# Patient Record
Sex: Male | Born: 1995 | Race: Black or African American | Hispanic: No | Marital: Single | Smoking: Never smoker
Health system: Southern US, Community
[De-identification: ages and names within clinical notes are randomized; demographics above are authoritative.]

---

## 2019-10-30 ENCOUNTER — Encounter (HOSPITAL_COMMUNITY): Payer: Self-pay | Admitting: Emergency Medicine

## 2019-10-30 ENCOUNTER — Ambulatory Visit (HOSPITAL_COMMUNITY)
Admission: EM | Admit: 2019-10-30 | Discharge: 2019-10-30 | Disposition: A | Discharge status: Home / Self Care | Payer: Self-pay | Attending: Family Medicine | Admitting: Family Medicine

## 2019-10-30 ENCOUNTER — Other Ambulatory Visit: Payer: Self-pay

## 2019-10-30 DIAGNOSIS — R0789 Other chest pain: Secondary | ICD-10-CM

## 2019-10-30 MED ORDER — IBUPROFEN 600 MG PO TABS: 600.0000 mg | ORAL_TABLET | Freq: Three times a day (TID) | ORAL | 0 refills | Status: DC | PRN

## 2019-10-30 MED ORDER — CYCLOBENZAPRINE HCL 10 MG PO TABS: 5.0000 mg | ORAL_TABLET | Freq: Every day | ORAL | 0 refills | Status: DC

## 2019-10-30 NOTE — ED Provider Notes (Signed)
Dawn    CSN: [id number] Arrival date & time: 10/30/19  5361      History   Chief Complaint Chief Complaint  Patient presents with  . Chest Pain    HPI Jiovanny Burdell is a 24 y.o. male.   Patient is a 24 year old male that presents today with left-sided chest pain.  This has been intermittent over the past 3 days.  The pain is worsened with certain movements, deep breathing.  Does a lot of heavy lifting at work.  Denies any associated cough, chest congestion, fever, shortness of breath or injuries.  ROS per HPI      History reviewed. No pertinent past medical history.  There are no problems to display for this patient.   History reviewed. No pertinent surgical history.     Home Medications    Prior to Admission medications   Medication Sig Start Date End Date Taking? Authorizing Provider  cyclobenzaprine (FLEXERIL) 10 MG tablet Take 0.5 tablets (5 mg total) by mouth at bedtime. 10/30/19   Loura Halt A, NP  ibuprofen (ADVIL) 600 MG tablet Take 1 tablet (600 mg total) by mouth every 8 (eight) hours as needed for moderate pain. 10/30/19   Orvan July, NP    Family History Family History  Family history unknown: Yes    Social History Social History   Tobacco Use  . Smoking status: Never Smoker  Substance Use Topics  . Alcohol use: Not Currently  . Drug use: Not Currently    Types: Marijuana     Allergies   Patient has no known allergies.   Review of Systems Review of Systems   Physical Exam Triage Vital Signs ED Triage Vitals  Enc Vitals Group     BP 10/30/19 0948 119/72     Pulse Rate 10/30/19 0948 70     Resp 10/30/19 0948 16     Temp 10/30/19 0948 98.2 F (36.8 C)     Temp src --      SpO2 10/30/19 0948 100 %     Weight --      Height --      Head Circumference --      Peak Flow --      Pain Score 10/30/19 0949 0     Pain Loc --      Pain Edu? --      Excl. in [place]? --    No data found.  Updated Vital  Signs BP 119/72   Pulse 70   Temp 98.2 F (36.8 C)   Resp 16   SpO2 100%   Visual Acuity Right Eye Distance:   Left Eye Distance:   Bilateral Distance:    Right Eye Near:   Left Eye Near:    Bilateral Near:     Physical Exam Vitals and nursing note reviewed.  Constitutional:      General: He is not in acute distress.    Appearance: Normal appearance. He is not ill-appearing, toxic-appearing or diaphoretic.  HENT:     Head: Normocephalic and atraumatic.     Nose: Nose normal.  Eyes:     Conjunctiva/sclera: Conjunctivae normal.  Cardiovascular:     Rate and Rhythm: Normal rate and regular rhythm.     Pulses: Normal pulses.     Heart sounds: Normal heart sounds.  Pulmonary:     Effort: Pulmonary effort is normal.     Breath sounds: Normal breath sounds.  Chest:     Chest wall: Tenderness  present. No mass.       Comments: TTP Abdominal:     Palpations: Abdomen is soft.     Tenderness: There is no abdominal tenderness.  Musculoskeletal:        General: Normal range of motion.     Cervical back: Normal range of motion.  Skin:    General: Skin is warm and dry.  Neurological:     Mental Status: He is alert.  Psychiatric:        Mood and Affect: Mood normal.      UC Treatments / Results  Labs (all labs ordered are listed, but only abnormal results are displayed) Labs Reviewed - No data to display  EKG   Radiology No results found.  Procedures Procedures (including critical care time)  Medications Ordered in UC Medications - No data to display  Initial Impression / Assessment and Plan / UC Course  I have reviewed the triage vital signs and the nursing notes.  Pertinent labs & imaging results that were available during my care of the patient were reviewed by me and considered in my medical decision making (see chart for details).     Chest wall pain-chest pain is reproducible. Exam benign Will treat with low-dose muscle relaxant and ibuprofen  every 8 hours Recommended heat Follow up as needed for continued or worsening symptoms  Final Clinical Impressions(s) / UC Diagnoses   Final diagnoses:  Chest wall pain     Discharge Instructions     This is most likely musculoskeletal pain.  Take the ibuprofen every 8 hours for mild to moderate pain Low-dose muscle relaxant to use at bedtime You can apply heat to the area Follow up as needed for continued or worsening symptoms     ED Prescriptions    Medication Sig Dispense Auth. Provider   cyclobenzaprine (FLEXERIL) 10 MG tablet Take 0.5 tablets (5 mg total) by mouth at bedtime. 20 tablet Sherrian Nunnelley A, NP   ibuprofen (ADVIL) 600 MG tablet Take 1 tablet (600 mg total) by mouth every 8 (eight) hours as needed for moderate pain. 30 tablet Dahlia Byes A, NP     PDMP not reviewed this encounter.   Janace Aris, NP 10/30/19 1028

## 2019-10-30 NOTE — ED Triage Notes (Signed)
Pt c/o chest pain in the L side x3 days. States if he inhale/exhale, if he raises his L arm, if he does a sit up it pulls on the area as well.

## 2019-10-30 NOTE — Discharge Instructions (Signed)
This is most likely musculoskeletal pain.  Take the ibuprofen every 8 hours for mild to moderate pain Low-dose muscle relaxant to use at bedtime You can apply heat to the area Follow up as needed for continued or worsening symptoms

## 2020-05-20 ENCOUNTER — Other Ambulatory Visit: Payer: Self-pay

## 2020-05-20 ENCOUNTER — Other Ambulatory Visit: Payer: Self-pay | Admitting: Critical Care Medicine

## 2020-05-20 DIAGNOSIS — Z20822 Contact with and (suspected) exposure to covid-19: Secondary | ICD-10-CM

## 2020-05-22 LAB — NOVEL CORONAVIRUS, NAA: SARS-CoV-2, NAA: NOT DETECTED

## 2020-05-22 LAB — SARS-COV-2, NAA 2 DAY TAT

## 2020-07-04 ENCOUNTER — Other Ambulatory Visit: Payer: Self-pay

## 2020-07-04 ENCOUNTER — Encounter (HOSPITAL_COMMUNITY): Payer: Self-pay | Admitting: *Deleted

## 2020-07-04 ENCOUNTER — Ambulatory Visit (HOSPITAL_COMMUNITY)
Admission: EM | Admit: 2020-07-04 | Discharge: 2020-07-04 | Disposition: A | Discharge status: Home / Self Care | Payer: Self-pay | Attending: Family Medicine | Admitting: Family Medicine

## 2020-07-04 DIAGNOSIS — K029 Dental caries, unspecified: Secondary | ICD-10-CM

## 2020-07-04 DIAGNOSIS — K047 Periapical abscess without sinus: Secondary | ICD-10-CM

## 2020-07-04 MED ORDER — AMOXICILLIN 500 MG PO CAPS: 1000.0000 mg | ORAL_CAPSULE | Freq: Two times a day (BID) | ORAL | 0 refills | Status: DC

## 2020-07-04 MED ORDER — KETOROLAC TROMETHAMINE 30 MG/ML IJ SOLN
INTRAMUSCULAR | Status: AC
  Filled 2020-07-04: qty 1

## 2020-07-04 MED ORDER — KETOROLAC TROMETHAMINE 30 MG/ML IJ SOLN
30.0000 mg | Freq: Once | INTRAMUSCULAR | Status: AC
  Administered 2020-07-04: 30 mg via INTRAMUSCULAR

## 2020-07-04 MED ORDER — IBUPROFEN 600 MG PO TABS: 600.0000 mg | ORAL_TABLET | Freq: Three times a day (TID) | ORAL | 0 refills | Status: DC | PRN

## 2020-07-04 NOTE — Discharge Instructions (Addendum)
Treating you for a dental infection.  Take the medication as prescribed. Resources given for dental for follow-up in your discharge instructions.  [facility] - [facility] Address: [address] [place], [place], [address] Phone: [phone]   - Dr. Lawrence Marseilles Address: [address] [place], [place], [address] Phone: [phone]

## 2020-07-04 NOTE — ED Triage Notes (Signed)
Pt reports dental pain in all 4 corners of his mouth top and bottom.

## 2020-07-04 NOTE — ED Provider Notes (Signed)
[facility]    CSN: [id number] Arrival date & time: 07/04/20  2440      History   Chief Complaint Chief Complaint  Patient presents with  . Dental Pain    HPI Rick Cooper is a 24 y.o. male.   Pt is a 24 year old male that presents with dental pain. Generalized to upper and lower molar areas.  Has multiple cracked teeth and dental caries. No fever or trismus. Using OTC medicines to treat without much relief.      History reviewed. No pertinent past medical history.  There are no problems to display for this patient.   History reviewed. No pertinent surgical history.     Home Medications    Prior to Admission medications   Medication Sig Start Date End Date Taking? Authorizing Provider  amoxicillin (AMOXIL) 500 MG capsule Take 2 capsules (1,000 mg total) by mouth 2 (two) times daily. 07/04/20   Dahlia Byes A, NP  ibuprofen (ADVIL) 600 MG tablet Take 1 tablet (600 mg total) by mouth every 8 (eight) hours as needed for moderate pain. 07/04/20   Janace Aris, NP    Family History Family History  Family history unknown: Yes    Social History Social History   Tobacco Use  . Smoking status: Never Smoker  . Smokeless tobacco: Never Used  Substance Use Topics  . Alcohol use: Not Currently  . Drug use: Not Currently    Types: Marijuana     Allergies   Patient has no known allergies.   Review of Systems Review of Systems   Physical Exam Triage Vital Signs ED Triage Vitals  Enc Vitals Group     BP 07/04/20 0910 (!) 102/33     Pulse Rate 07/04/20 0910 97     Resp 07/04/20 0910 16     Temp 07/04/20 0910 98 F (36.7 C)     Temp Source 07/04/20 0910 Oral     SpO2 07/04/20 0910 98 %     Weight 07/04/20 0914 135 lb (61.2 kg)     Height 07/04/20 0914 6' (1.829 m)     Head Circumference --      Peak Flow --      Pain Score 07/04/20 0914 4     Pain Loc --      Pain Edu? --      Excl. in [place]? --    No data found.  Updated Vital  Signs BP (!) 102/33 (BP Location: Right Arm)   Pulse 97   Temp 98 F (36.7 C) (Oral)   Resp 16   Ht 6' (1.829 m)   Wt 135 lb (61.2 kg)   SpO2 98%   BMI 18.31 kg/m   Visual Acuity Right Eye Distance:   Left Eye Distance:   Bilateral Distance:    Right Eye Near:   Left Eye Near:    Bilateral Near:     Physical Exam Vitals and nursing note reviewed.  Constitutional:      Appearance: Normal appearance.  HENT:     Head: Normocephalic and atraumatic.     Nose: Nose normal.     Mouth/Throat:     Dentition: Dental tenderness, gingival swelling and dental caries present.  Eyes:     Conjunctiva/sclera: Conjunctivae normal.  Pulmonary:     Effort: Pulmonary effort is normal.  Musculoskeletal:        General: Normal range of motion.     Cervical back: Normal range of motion.  Skin:    General: Skin is warm and dry.  Neurological:     Mental Status: He is alert.  Psychiatric:        Mood and Affect: Mood normal.      UC Treatments / Results  Labs (all labs ordered are listed, but only abnormal results are displayed) Labs Reviewed - No data to display  EKG   Radiology No results found.  Procedures Procedures (including critical care time)  Medications Ordered in UC Medications  ketorolac (TORADOL) 30 MG/ML injection 30 mg (30 mg Intramuscular Given 07/04/20 0953)    Initial Impression / Assessment and Plan / UC Course  I have reviewed the triage vital signs and the nursing notes.  Pertinent labs & imaging results that were available during my care of the patient were reviewed by me and considered in my medical decision making (see chart for details).     Dental caries Dental infection Treating with abx and ibuprofen for pain Toradol given here for pain.  Dental resources given.  Final Clinical Impressions(s) / UC Diagnoses   Final diagnoses:  Dental caries  Dental infection     Discharge Instructions     Treating you for a dental infection.   Take the medication as prescribed. Resources given for dental for follow-up in your discharge instructions.  [facility] - [facility] Address: [address], [place], [place], [address] Phone: [phone]   - Dr. Lawrence Marseilles Address: [address], [place], [place], [address] Phone: [phone]       ED Prescriptions    Medication Sig Dispense Auth. Provider   amoxicillin (AMOXIL) 500 MG capsule Take 2 capsules (1,000 mg total) by mouth 2 (two) times daily. 40 capsule Aryelle Figg A, NP   ibuprofen (ADVIL) 600 MG tablet Take 1 tablet (600 mg total) by mouth every 8 (eight) hours as needed for moderate pain. 30 tablet Dahlia Byes A, NP     PDMP not reviewed this encounter.   Janace Aris, NP 07/04/20 [phone]

## 2020-08-18 ENCOUNTER — Encounter (HOSPITAL_COMMUNITY): Payer: Self-pay

## 2020-08-18 ENCOUNTER — Other Ambulatory Visit: Payer: Self-pay

## 2020-08-18 ENCOUNTER — Ambulatory Visit (HOSPITAL_COMMUNITY)
Admission: EM | Admit: 2020-08-18 | Discharge: 2020-08-18 | Disposition: A | Discharge status: Home / Self Care | Payer: HRSA Program | Attending: Emergency Medicine | Admitting: Emergency Medicine

## 2020-08-18 DIAGNOSIS — U071 COVID-19: Secondary | ICD-10-CM | POA: Diagnosis not present

## 2020-08-18 DIAGNOSIS — J029 Acute pharyngitis, unspecified: Secondary | ICD-10-CM | POA: Diagnosis present

## 2020-08-18 DIAGNOSIS — Z20822 Contact with and (suspected) exposure to covid-19: Secondary | ICD-10-CM | POA: Diagnosis present

## 2020-08-18 LAB — RESP PANEL BY RT-PCR (FLU A&B, COVID) ARPGX2
Influenza A by PCR: NEGATIVE
Influenza B by PCR: NEGATIVE
SARS Coronavirus 2 by RT PCR: POSITIVE — AB

## 2020-08-18 LAB — POCT RAPID STREP A, ED / UC: Streptococcus, Group A Screen (Direct): NEGATIVE

## 2020-08-18 MED ORDER — IBUPROFEN 600 MG PO TABS: 600.0000 mg | ORAL_TABLET | Freq: Four times a day (QID) | ORAL | 0 refills | Status: DC | PRN

## 2020-08-18 NOTE — ED Triage Notes (Signed)
Pt in with c/o ST and cough that started yesterday  Has not had medication for sxs  Denies runny nose, fever, sob

## 2020-08-18 NOTE — ED Provider Notes (Addendum)
HPI  SUBJECTIVE:  Patient reports sore throat starting this morning.  No aggravating or alleviating factors.  Has not tried anything for this.   No fever   No neck stiffness  + Cough No nasal congestion, rhinorrhea No Myalgias No Headache No Rash  No loss of taste or smell No shortness of breath or difficulty breathing No nausea, vomiting No diarrhea No abdominal pain     No Recent known strep, mono, COVID, flu exposure, however son and wife currently have identical symptoms.  He does not yet have a diagnosis.  No Breathing difficulty, voice changes, sensation of throat swelling shut No Drooling No Trismus No abx in past month. Patient did not get the Covid or flu vaccine No antipyretic in past 4-6 hrs  Past medical history negative for asthma, hypertension, diabetes, frequent strep. PMD: None.   History reviewed. No pertinent past medical history.  History reviewed. No pertinent surgical history.  Family History  Family history unknown: Yes    Social History   Tobacco Use  . Smoking status: Never Smoker  . Smokeless tobacco: Never Used  Substance Use Topics  . Alcohol use: Not Currently  . Drug use: Not Currently    Types: Marijuana    No current facility-administered medications for this encounter.  Current Outpatient Medications:  .  ibuprofen (ADVIL) 600 MG tablet, Take 1 tablet (600 mg total) by mouth every 6 (six) hours as needed., Disp: 30 tablet, Rfl: 0  No Known Allergies   ROS  As noted in HPI.   Physical Exam  BP 113/67 (BP Location: Right Arm)   Pulse 76   Temp 98.3 F (36.8 C) (Oral)   Resp 20   SpO2 98%   Constitutional: Well developed, well nourished, no acute distress Eyes:  EOMI, conjunctiva normal bilaterally HENT: Normocephalic, atraumatic,mucus membranes moist. - nasal congestion + mildly erythematous oropharynx - enlarged tonsils - exudates. Uvula midline.  Respiratory: Normal inspiratory effort, lungs clear  bilaterally Cardiovascular: Normal rate, no murmurs, rubs, gallops GI: nondistended, nontender. No appreciable splenomegaly skin: No rash, skin intact Lymph: - Anterior cervical LN.  No posterior cervical lymphadenopathy Musculoskeletal: no deformities Neurologic: Alert & oriented x 3, no focal neuro deficits Psychiatric: Speech and behavior appropriate.  ED Course   Medications - No data to display  Orders Placed This Encounter  Procedures  . Resp Panel by RT-PCR (Flu A&B, Covid) Nasopharyngeal Swab    Standing Status:   Standing    Number of Occurrences:   1    Order Specific Question:   Is this test for diagnosis or screening    Answer:   Diagnosis of ill patient    Order Specific Question:   Symptomatic for COVID-19 as defined by CDC    Answer:   Yes    Order Specific Question:   Date of Symptom Onset    Answer:   08/18/2020    Order Specific Question:   Hospitalized for COVID-19    Answer:   No    Order Specific Question:   Admitted to ICU for COVID-19    Answer:   No    Order Specific Question:   Previously tested for COVID-19    Answer:   No    Order Specific Question:   Resident in a congregate (group) care setting    Answer:   No    Order Specific Question:   Employed in healthcare setting    Answer:   No    Order Specific Question:  Has patient completed COVID vaccination(s) (2 doses of Pfizer/Moderna 1 dose of [employer])    Answer:   No  . Culture, group A strep (throat)    Standing Status:   Standing    Number of Occurrences:   1  . POCT Rapid Strep A    Standing Status:   Standing    Number of Occurrences:   1    Results for orders placed or performed during the hospital encounter of 08/18/20 (from the past 24 hour(s))  POCT Rapid Strep A     Status: None   Collection Time: 08/18/20  9:02 AM  Result Value Ref Range   Streptococcus, Group A Screen (Direct) NEGATIVE NEGATIVE  Resp Panel by RT-PCR (Flu A&B, Covid) Nasopharyngeal Swab     Status:  Abnormal   Collection Time: 08/18/20  9:20 AM   Specimen: Nasopharyngeal Swab; Nasopharyngeal(NP) swabs in vial transport medium  Result Value Ref Range   SARS Coronavirus 2 by RT PCR POSITIVE (A) NEGATIVE   Influenza A by PCR NEGATIVE NEGATIVE   Influenza B by PCR NEGATIVE NEGATIVE   No results found.  ED Clinical Impression  1. COVID-19 virus infection   2. Viral pharyngitis   3. Encounter for laboratory testing for COVID-19 virus      ED Assessment/Plan  Rapid strep negative. Patient with a viral pharyngitis.  Sent off COVID, flu testing.  May be a candidate for monoclonal antibody infusion because he is not vaccinated however he has no other comorbidities.  Will prescribe Tamiflu if flu is positive.  Obtaining throat culture to guide antibiotic treatment.  We'll contact him if culture is positive, and will call in Appropriate antibiotics. Patient home with ibuprofen, Tylenol, Benadryl/Maalox mixture. Patient to followup with PMD of choice when necessary, will refer to local primary care resources.  COVID positive.sent message to MAB team. Unable to send my chart message to patient.  Discussed labs,  MDM, plan and followup with patient. Discussed sn/sx that should prompt return to the ED. patient agrees with plan.   Meds ordered this encounter  Medications  . ibuprofen (ADVIL) 600 MG tablet    Sig: Take 1 tablet (600 mg total) by mouth every 6 (six) hours as needed.    Dispense:  30 tablet    Refill:  0     *This clinic note was created using Scientist, clinical (histocompatibility and immunogenetics). Therefore, there may be occasional mistakes despite careful proofreading.     Domenick Gong, MD 08/18/20 1610    Domenick Gong, MD 08/19/20 [phone]

## 2020-08-18 NOTE — Discharge Instructions (Addendum)
your rapid strep was negative today, so we have sent off a throat culture.  We will contact you and call in the appropriate antibiotics if your culture comes back positive for an infection requiring antibiotic treatment.  Give [place] a working phone number.  Someone will contact you if your flu or Covid comes back positive.  I will prescribe Tamiflu if your flu is positive.  1 gram of Tylenol and 600 mg ibuprofen together 3-4 times a day as needed for pain.  Make sure you drink plenty of extra fluids.  Some people find salt water gargles and  Traditional Medicinal's "Throat Coat" tea helpful. Take 5 mL of liquid Benadryl and 5 mL of Maalox. Mix it together, and then hold it in your mouth for as long as you can and then swallow. You may do this 4 times a day.     Below is a list of primary care practices who are taking new patients for you to follow-up with.  [facility] internal medicine clinic Ground Floor - [facility], [address] [place], [place], [place] [address] [phone]  [facility] [address] [place], [place] [address] [phone]  [facility] [address] [place], [place] [address] [phone]  [facility] [phone] [address] [place] [address]  [facility]: [address] [place] [address]  [phone]  [facility]: [address] [place] [address]   [phone]  [facility]: [address] [place] [address]  [phone]  [facility] : [address] [place] [address] [phone]  [facility] Primary [facility]: [address] [place] [address] [phone]  [facility]: [address] [place] [address] [phone]  Dr. Oneal Grout [address] [facility] [place] [address]  [phone]  Dr. Jackie Plum, [facility]. [address] [place], [place] [address]  [phone]  Go to www.goodrx.com to look up your medications. This will give you a list of where you can find your prescriptions at the most affordable prices. Or ask the pharmacist what the cash price is, or if they have any other discount programs available to help make your medication more affordable. This can be less expensive than what you would pay with insurance.

## 2020-08-20 LAB — CULTURE, GROUP A STREP (THRC)

## 2020-08-25 ENCOUNTER — Other Ambulatory Visit: Payer: Self-pay

## 2020-08-25 ENCOUNTER — Encounter (HOSPITAL_COMMUNITY): Payer: Self-pay

## 2020-08-25 ENCOUNTER — Emergency Department (HOSPITAL_COMMUNITY)
Admission: EM | Admit: 2020-08-25 | Discharge: 2020-08-25 | Disposition: A | Discharge status: Home / Self Care | Payer: Self-pay | Attending: Emergency Medicine | Admitting: Emergency Medicine

## 2020-08-25 ENCOUNTER — Ambulatory Visit (HOSPITAL_COMMUNITY)
Admission: EM | Admit: 2020-08-25 | Discharge: 2020-08-25 | Disposition: A | Discharge status: Home / Self Care | Payer: Self-pay | Attending: Student | Admitting: Student

## 2020-08-25 DIAGNOSIS — U071 COVID-19: Secondary | ICD-10-CM

## 2020-08-25 DIAGNOSIS — J36 Peritonsillar abscess: Secondary | ICD-10-CM

## 2020-08-25 DIAGNOSIS — Z5321 Procedure and treatment not carried out due to patient leaving prior to being seen by health care provider: Secondary | ICD-10-CM | POA: Insufficient documentation

## 2020-08-25 MED ORDER — ACETAMINOPHEN 325 MG PO TABS
650.0000 mg | ORAL_TABLET | Freq: Once | ORAL | Status: AC | PRN
  Administered 2020-08-25: 650 mg via ORAL
  Filled 2020-08-25: qty 2

## 2020-08-25 MED ORDER — KETOROLAC TROMETHAMINE 60 MG/2ML IM SOLN
INTRAMUSCULAR | Status: AC
  Filled 2020-08-25: qty 2

## 2020-08-25 MED ORDER — KETOROLAC TROMETHAMINE 60 MG/2ML IM SOLN
60.0000 mg | Freq: Once | INTRAMUSCULAR | Status: AC
  Administered 2020-08-25: 60 mg via INTRAMUSCULAR

## 2020-08-25 NOTE — ED Provider Notes (Signed)
[facility]    CSN: [id number] Arrival date & time: 08/25/20  7628      History   Chief Complaint Chief Complaint  Patient presents with  . Sore Throat  . Cough  . Fever    HPI Rick Cooper is a 25 y.o. male Presenting for URI symptoms for 7 days; he is covid positive. Endorses sore throat, cough, fever. Notes tonsillar swelling and pain that is getting worse. Fevers and cough are improving on their own. Has been taking tylenol/ibuprofen with some improvement in symptoms. Endorses shortness of breath at rest that gets worse with ambulation. Denies n/v/d, chest pain, congestion, facial pain, teeth pain, headaches,  loss of taste/smell, swollen lymph nodes, ear pain.  Denies chest pain, shortness of breath, confusion, high fevers.    HPI  History reviewed. No pertinent past medical history.  There are no problems to display for this patient.   History reviewed. No pertinent surgical history.     Home Medications    Prior to Admission medications   Medication Sig Start Date End Date Taking? Authorizing Provider  ibuprofen (ADVIL) 600 MG tablet Take 1 tablet (600 mg total) by mouth every 6 (six) hours as needed. 08/18/20  Yes Domenick Gong, MD    Family History Family History  Family history unknown: Yes    Social History Social History   Tobacco Use  . Smoking status: Never Smoker  . Smokeless tobacco: Never Used  Substance Use Topics  . Alcohol use: Not Currently  . Drug use: Not Currently    Types: Marijuana     Allergies   Patient has no known allergies.   Review of Systems Review of Systems  Constitutional: Positive for chills and fever. Negative for appetite change.  HENT: Positive for sore throat. Negative for congestion, ear pain, rhinorrhea, sinus pressure and sinus pain.   Eyes: Negative for redness and visual disturbance.  Respiratory: Positive for cough. Negative for chest tightness, shortness of breath and wheezing.    Cardiovascular: Negative for chest pain and palpitations.  Gastrointestinal: Negative for abdominal pain, constipation, diarrhea, nausea and vomiting.  Genitourinary: Negative for dysuria, frequency and urgency.  Musculoskeletal: Negative for myalgias.  Neurological: Negative for dizziness, weakness and headaches.  Psychiatric/Behavioral: Negative for confusion.  All other systems reviewed and are negative.    Physical Exam Triage Vital Signs ED Triage Vitals  Enc Vitals Group     BP 08/25/20 0946 116/73     Pulse Rate 08/25/20 0946 70     Resp 08/25/20 0946 19     Temp 08/25/20 0946 99 F (37.2 C)     Temp Source 08/25/20 0946 Oral     SpO2 08/25/20 0946 100 %     Weight --      Height --      Head Circumference --      Peak Flow --      Pain Score 08/25/20 0945 10     Pain Loc --      Pain Edu? --      Excl. in GC? --    No data found.  Updated Vital Signs BP 116/73 (BP Location: Left Arm)   Pulse 70   Temp 99 F (37.2 C) (Oral)   Resp 19   SpO2 100%   Visual Acuity Right Eye Distance:   Left Eye Distance:   Bilateral Distance:    Right Eye Near:   Left Eye Near:    Bilateral Near:  Physical Exam Vitals reviewed.  Constitutional:      General: He is not in acute distress.    Appearance: Normal appearance. He is not ill-appearing.  HENT:     Head: Normocephalic and atraumatic.     Right Ear: Hearing, tympanic membrane, ear canal and external ear normal. No swelling or tenderness. There is no impacted cerumen. No mastoid tenderness. Tympanic membrane is not perforated, erythematous, retracted or bulging.     Left Ear: Hearing, tympanic membrane, ear canal and external ear normal. No swelling or tenderness. There is no impacted cerumen. No mastoid tenderness. Tympanic membrane is not perforated, erythematous, retracted or bulging.     Nose:     Right Sinus: No maxillary sinus tenderness or frontal sinus tenderness.     Left Sinus: No maxillary sinus  tenderness or frontal sinus tenderness.     Mouth/Throat:     Mouth: Mucous membranes are moist.     Pharynx: No oropharyngeal exudate or posterior oropharyngeal erythema.     Tonsils: Tonsillar abscess present. No tonsillar exudate.     Comments: Significant swelling L posterior pharynx. Uvula not midline. Pt with significant pain when opening mouth. No exudate/discharge visible. Cardiovascular:     Rate and Rhythm: Normal rate and regular rhythm.     Heart sounds: Normal heart sounds.  Pulmonary:     Breath sounds: Normal breath sounds and air entry. No wheezing, rhonchi or rales.  Chest:     Chest wall: No tenderness.  Abdominal:     General: Abdomen is flat. Bowel sounds are normal.     Tenderness: There is no abdominal tenderness. There is no guarding or rebound.  Lymphadenopathy:     Cervical: No cervical adenopathy.  Neurological:     General: No focal deficit present.     Mental Status: He is alert and oriented to person, place, and time.  Psychiatric:        Attention and Perception: Attention and perception normal.        Mood and Affect: Mood and affect normal.        Behavior: Behavior normal. Behavior is cooperative.        Thought Content: Thought content normal.        Judgment: Judgment normal.      UC Treatments / Results  Labs (all labs ordered are listed, but only abnormal results are displayed) Labs Reviewed - No data to display  EKG   Radiology No results found.  Procedures Procedures (including critical care time)  Medications Ordered in UC Medications  ketorolac (TORADOL) injection 60 mg (has no administration in time range)    Initial Impression / Assessment and Plan / UC Course  I have reviewed the triage vital signs and the nursing notes.  Pertinent labs & imaging results that were available during my care of the patient were reviewed by me and considered in my medical decision making (see chart for details).     12/30 covid positive.  Rapid strep negative 12/30, culture sent and was negative. Patient today presenting with peritonsillar abscess. Toradol injection provided and he will head straight to ED.  afebrile nontachycardic nontachypneic, oxygenating well on room air. He is currently hemodynamically stable to transport himself to [facility] ED. He understands to immediately call EMS if he develops worsening shortness of breath, chest pain, etc while en route.   Covid and influenza tests sent again today; he tested positive for covid 6 days ago. Symptomatic relief with OTC Mucinex, Nyquil, etc.  Return precautions- new/worsening fevers/chills, shortness of breath, chest pain, abd pain, etc.   Final Clinical Impressions(s) / UC Diagnoses   Final diagnoses:  Peritonsillar abscess  COVID-19     Discharge Instructions     -Head straight to [facility] ER for evaluation of peritonsillar swelling/abscess.   We are currently awaiting result of your PCR covid-19 test. This typically comes back in 1-2 days. We'll call you if the result is positive. Otherwise, the result will be sent electronically to your MyChart. You can also call this clinic and ask for your result via telephone.   Please isolate at home while awaiting these results. If your test is positive for Covid-19, continue to isolate at home for 5 days if you have mild symptoms, or a total of 10 days from symptom onset if you have more severe symptoms. If you quarantine for a shorter period of time (i.e. 5 days), make sure to wear a mask until day 10 of symptoms. Treat your symptoms at home with OTC remedies like tylenol/ibuprofen, mucinex, nyquil, etc. Seek medical attention if you develop high fevers, chest pain, shortness of breath, ear pain, facial pain, etc. Make sure to get up and move around every 2-3 hours while convalescing to help prevent blood clots. Drink plenty of fluids, and rest as much as possible.     ED Prescriptions    None     PDMP not reviewed  this encounter.   Rhys Martini, PA-C 08/25/20 1044

## 2020-08-25 NOTE — ED Triage Notes (Signed)
Pt coming in c/o left side throat pain x4 days. Seen at [facility] today and told to come here for evaluation of "cyst in throat." report difficulty swallowing and breathing due to pain. covid + on 12/31. Talking in complete sentences in triage.

## 2020-08-25 NOTE — ED Triage Notes (Signed)
Pt c/o sore throat x 4 days. Pt states he noticed swelling on both sides of his throat. Pt states he had a fever and cough a few days ago. He states he took advil this morning and ibuprofen yesterday.

## 2020-08-25 NOTE — Discharge Instructions (Addendum)
-  Head straight to [facility] ER for evaluation of peritonsillar swelling/abscess.   We are currently awaiting result of your PCR covid-19 test. This typically comes back in 1-2 days. We'll call you if the result is positive. Otherwise, the result will be sent electronically to your MyChart. You can also call this clinic and ask for your result via telephone.   Please isolate at home while awaiting these results. If your test is positive for Covid-19, continue to isolate at home for 5 days if you have mild symptoms, or a total of 10 days from symptom onset if you have more severe symptoms. If you quarantine for a shorter period of time (i.e. 5 days), make sure to wear a mask until day 10 of symptoms. Treat your symptoms at home with OTC remedies like tylenol/ibuprofen, mucinex, nyquil, etc. Seek medical attention if you develop high fevers, chest pain, shortness of breath, ear pain, facial pain, etc. Make sure to get up and move around every 2-3 hours while convalescing to help prevent blood clots. Drink plenty of fluids, and rest as much as possible.

## 2020-08-25 NOTE — ED Notes (Signed)
Patient is being discharged from the Urgent Care and sent to the Emergency Department via private vehicle . Per laura graham, pa, patient is in need of higher level of care due to peritonsillar abscess. Patient is aware and verbalizes understanding of plan of care.  Vitals:   08/25/20 0946  BP: 116/73  Pulse: 70  Resp: 19  Temp: 99 F (37.2 C)  SpO2: 100%

## 2020-08-26 ENCOUNTER — Emergency Department (HOSPITAL_COMMUNITY): Payer: Self-pay

## 2020-08-26 ENCOUNTER — Emergency Department (HOSPITAL_COMMUNITY)
Admission: EM | Admit: 2020-08-26 | Discharge: 2020-08-26 | Disposition: A | Discharge status: Home / Self Care | Payer: Self-pay | Attending: Emergency Medicine | Admitting: Emergency Medicine

## 2020-08-26 ENCOUNTER — Other Ambulatory Visit: Payer: Self-pay

## 2020-08-26 ENCOUNTER — Encounter (HOSPITAL_COMMUNITY): Payer: Self-pay | Admitting: Emergency Medicine

## 2020-08-26 DIAGNOSIS — Z8616 Personal history of COVID-19: Secondary | ICD-10-CM | POA: Insufficient documentation

## 2020-08-26 DIAGNOSIS — J36 Peritonsillar abscess: Secondary | ICD-10-CM | POA: Insufficient documentation

## 2020-08-26 LAB — I-STAT CHEM 8, ED
BUN: 6 mg/dL (ref 6–20)
Calcium, Ion: 1.19 mmol/L (ref 1.15–1.40)
Chloride: 96 mmol/L — ABNORMAL LOW (ref 98–111)
Creatinine, Ser: 0.6 mg/dL — ABNORMAL LOW (ref 0.61–1.24)
Glucose, Bld: 96 mg/dL (ref 70–99)
HCT: 43 % (ref 39.0–52.0)
Hemoglobin: 14.6 g/dL (ref 13.0–17.0)
Potassium: 3.8 mmol/L (ref 3.5–5.1)
Sodium: 136 mmol/L (ref 135–145)
TCO2: 30 mmol/L (ref 22–32)

## 2020-08-26 MED ORDER — DEXAMETHASONE SODIUM PHOSPHATE 10 MG/ML IJ SOLN
10.0000 mg | Freq: Once | INTRAMUSCULAR | Status: DC
  Filled 2020-08-26: qty 1

## 2020-08-26 MED ORDER — CLINDAMYCIN PHOSPHATE 600 MG/50ML IV SOLN
600.0000 mg | Freq: Once | INTRAVENOUS | Status: AC
  Administered 2020-08-26: 600 mg via INTRAVENOUS
  Filled 2020-08-26: qty 50

## 2020-08-26 MED ORDER — OXYCODONE-ACETAMINOPHEN 5-325 MG PO TABS
1.0000 | ORAL_TABLET | Freq: Once | ORAL | Status: AC
  Administered 2020-08-26: 1 via ORAL
  Filled 2020-08-26: qty 1

## 2020-08-26 MED ORDER — SODIUM CHLORIDE 0.9 % IV BOLUS
1000.0000 mL | Freq: Once | INTRAVENOUS | Status: AC
  Administered 2020-08-26: 1000 mL via INTRAVENOUS

## 2020-08-26 MED ORDER — DEXAMETHASONE SODIUM PHOSPHATE 10 MG/ML IJ SOLN
10.0000 mg | Freq: Once | INTRAMUSCULAR | Status: AC
  Administered 2020-08-26: 10 mg via INTRAVENOUS

## 2020-08-26 MED ORDER — HYDROCODONE-ACETAMINOPHEN 7.5-325 MG/15ML PO SOLN: 15.0000 mL | Freq: Four times a day (QID) | ORAL | 0 refills | Status: AC | PRN

## 2020-08-26 MED ORDER — CLINDAMYCIN HCL 300 MG PO CAPS: 300.0000 mg | ORAL_CAPSULE | Freq: Three times a day (TID) | ORAL | 0 refills | Status: AC

## 2020-08-26 MED ORDER — IOHEXOL 300 MG/ML  SOLN
75.0000 mL | Freq: Once | INTRAMUSCULAR | Status: AC | PRN
  Administered 2020-08-26: 75 mL via INTRAVENOUS

## 2020-08-26 NOTE — ED Triage Notes (Signed)
per pt, states he was seen yesterday for the same symptoms yesterday-states increased throat pain this am

## 2020-08-26 NOTE — Discharge Instructions (Addendum)
Your CT scan shows that you have a left-sided peritonsillar abscess.  I spoke to the on-call ENT specialist, Dr. Annalee Genta who recommends IV antibiotics here followed by antibiotics by mouth at home as this will typically resolve this type of infection in about 72 hours. It has been 72 hours and your symptoms have not improved or have worsened, you need to return to the ER as you may need drainage at that time. Return to the ER if you start to experience worsening pain, trouble moving your neck, shortness of breath or drooling. You can take the hydrocodone-acetaminophen syrup as needed to help with pain.

## 2020-08-26 NOTE — ED Provider Notes (Signed)
[facility] Provider Note   CSN: [id number] Arrival date & time: 08/26/20  9629     History Chief Complaint  Patient presents with   Sore Throat    Rick Cooper is a 25 y.o. male who presents to ED with a chief complaint of sore throat.  Viral URI symptoms began 7 days ago.  He tested positive for Covid when the he presented initially.  However his strep test was negative but continues to have worsening sore throat.  He was sent from urgent care for concerns for peritonsillar abscess.  Last took an antipyretic about 3 hours ago.  He reports pain is worse with speaking, eating.  No history of peritonsillar abscess.  No neck stiffness  HPI     History reviewed. No pertinent past medical history.  There are no problems to display for this patient.   History reviewed. No pertinent surgical history.     Family History  Family history unknown: Yes    Social History   Tobacco Use   Smoking status: Never Smoker   Smokeless tobacco: Never Used  Substance Use Topics   Alcohol use: Not Currently   Drug use: Not Currently    Types: Marijuana    Home Medications Prior to Admission medications   Medication Sig Start Date End Date Taking? Authorizing Provider  clindamycin (CLEOCIN) 300 MG capsule Take 1 capsule (300 mg total) by mouth 3 (three) times daily for 10 days. 08/26/20 09/05/20 Yes Takelia Urieta, PA-C  HYDROcodone-acetaminophen (HYCET) 7.5-325 mg/15 ml solution Take 15 mLs by mouth 4 (four) times daily as needed for moderate pain. 08/26/20 08/26/21 Yes Brooke Steinhilber, PA-C  ibuprofen (ADVIL) 600 MG tablet Take 1 tablet (600 mg total) by mouth every 6 (six) hours as needed. 08/18/20   Domenick Gong, MD    Allergies    Patient has no known allergies.  Review of Systems   Review of Systems  Constitutional: Negative for appetite change, chills and fever.  HENT: Positive for sore throat. Negative for ear pain, rhinorrhea and sneezing.    Eyes: Negative for photophobia and visual disturbance.  Respiratory: Negative for cough, chest tightness, shortness of breath and wheezing.   Cardiovascular: Negative for chest pain and palpitations.  Gastrointestinal: Negative for abdominal pain, blood in stool, constipation, diarrhea, nausea and vomiting.  Genitourinary: Negative for dysuria, hematuria and urgency.  Musculoskeletal: Negative for myalgias.  Skin: Negative for rash.  Neurological: Negative for dizziness, weakness and light-headedness.    Physical Exam Updated Vital Signs BP (!) 114/93    Pulse 71    Temp 99.2 F (37.3 C) (Oral)    Resp 20    SpO2 100%   Physical Exam Vitals and nursing note reviewed.  Constitutional:      General: He is not in acute distress.    Appearance: He is well-developed and well-nourished.  HENT:     Head: Normocephalic and atraumatic.     Nose: Nose normal.     Mouth/Throat:     Pharynx: Posterior oropharyngeal erythema present.     Tonsils: Tonsillar abscess present. No tonsillar exudate. 1+ on the right. 3+ on the left.     Comments: Patient with pain noted with opening mouth.  There is significant amount of swelling in the left posterior oropharynx.  Uvula is shifted slightly. No pooling of secretions. Patient is tolerating secretions and is not in respiratory distress. No neck pain or tenderness to palpation of the neck. Eyes:  General: No scleral icterus.       Left eye: No discharge.     Extraocular Movements: EOM normal.     Conjunctiva/sclera: Conjunctivae normal.  Cardiovascular:     Rate and Rhythm: Normal rate and regular rhythm.     Pulses: Intact distal pulses.     Heart sounds: Normal heart sounds. No murmur heard. No friction rub. No gallop.   Pulmonary:     Effort: Pulmonary effort is normal. No respiratory distress.     Breath sounds: Normal breath sounds.  Abdominal:     General: Bowel sounds are normal. There is no distension.     Palpations: Abdomen is  soft.     Tenderness: There is no abdominal tenderness. There is no guarding.  Musculoskeletal:        General: No edema. Normal range of motion.     Cervical back: Normal range of motion and neck supple.  Skin:    General: Skin is warm and dry.     Findings: No rash.  Neurological:     Mental Status: He is alert.     Motor: No abnormal muscle tone.     Coordination: Coordination normal.  Psychiatric:        Mood and Affect: Mood and affect normal.     ED Results / Procedures / Treatments   Labs (all labs ordered are listed, but only abnormal results are displayed) Labs Reviewed  I-STAT CHEM 8, ED - Abnormal; Notable for the following components:      Result Value   Chloride 96 (*)    Creatinine, Ser 0.60 (*)    All other components within normal limits    EKG None  Radiology CT Soft Tissue Neck W Contrast  Addendum Date: 08/26/2020   ADDENDUM REPORT: 08/26/2020 15:05 ADDENDUM: These results were called by telephone at the time of interpretation on 08/26/2020 at 3:05 pm to provider [facility] , who verbally acknowledged these results. Electronically Signed   By: Kellie Simmering DO   On: 08/26/2020 15:05   Result Date: 08/26/2020 CLINICAL DATA:  Neck abscess, deep tissue. Additional history provided: Patient reports neck pain for 5 days, low grade fever, left-sided neck pain, pain with swallowing and talking. EXAM: CT NECK WITH CONTRAST TECHNIQUE: Multidetector CT imaging of the neck was performed using the standard protocol following the bolus administration of intravenous contrast. CONTRAST:  50mL OMNIPAQUE IOHEXOL 300 MG/ML  SOLN COMPARISON:  75 mL Omnipaque 300 intravenous contrast FINDINGS: Pharynx and larynx: Marked prominence of the left palatine tonsil. Also in the region of the left palatine tonsil, there is a hypodense focus measuring 3.1 x 2.4 cm compatible with peritonsillar abscess in the appropriate clinical setting. To a lesser degree, there is also prominence of the right  palatine tonsil. Associated pharyngeal mucosal/submucosal edema (predominantly on the left). Moderate effacement of the oropharyngeal airway. The epiglottis and larynx are unremarkable. Salivary glands: No inflammation, mass, or stone. Thyroid: Unremarkable. Lymph nodes: Left-sided cervical lymphadenopathy, likely reactive. An index left level 2 lymph node measures 2.5 cm in short axis (series 6, image 55). Vascular: The major vascular structures of the neck are patent. Lymphadenopathy partially effaces the left internal jugular vein. Limited intracranial: No acute intracranial abnormality identified. Visualized orbits: Incompletely imaged. No orbital mass or acute finding at the imaged levels. Mastoids and visualized paranasal sinuses: Small mucous retention cysts within the right sphenoid and maxillary sinuses. Trace bilateral ethmoid sinus mucosal thickening. No significant mastoid effusion. Skeleton: No acute bony  abnormality or aggressive osseous lesion. Nonspecific reversal of the expected cervical lordosis. Cervical spondylosis with shallow multilevel disc bulges. Upper chest: No consolidation within the imaged lung apices. Other: Mild edema within the upper left neck. IMPRESSION: Findings compatible tonsillitis/pharyngitis and 3.1 x 2.4 cm left peritonsillar abscess. Moderate effacement of the oropharyngeal airway. Left-sided cervical lymphadenopathy, likely reactive. An enlarged left level 2 lymph node partially effaces the left internal jugular vein. Electronically Signed: By: Jackey Loge DO On: 08/26/2020 14:59    Procedures Procedures (including critical care time)  Medications Ordered in ED Medications  dexamethasone (DECADRON) injection 10 mg (10 mg Intravenous Given 08/26/20 1254)  iohexol (OMNIPAQUE) 300 MG/ML solution 75 mL (75 mLs Intravenous Contrast Given 08/26/20 1432)  clindamycin (CLEOCIN) IVPB 600 mg (0 mg Intravenous Stopped 08/26/20 1705)  sodium chloride 0.9 % bolus 1,000 mL (1,000  mLs Intravenous New Bag/Given 08/26/20 1638)  oxyCODONE-acetaminophen (PERCOCET/ROXICET) 5-325 MG per tablet 1 tablet (1 tablet Oral Given 08/26/20 1638)    ED Course  I have reviewed the triage vital signs and the nursing notes.  Pertinent labs & imaging results that were available during my care of the patient were reviewed by me and considered in my medical decision making (see chart for details).  Clinical Course as of 08/26/20 1805  Fri Aug 26, 2020  1614 Per Dr. Annalee Genta recommendations, clindamycin 600 mg IV here along with IV fluids and Decadron.  Will discharge with 300 mg clindamycin 3 times daily for 10 days and following up for recheck for improvement in 72 hours. [HK]    Clinical Course User Index [HK] Dietrich Pates, PA-C   MDM Rules/Calculators/A&P                          Fredrik Mogel was evaluated in Emergency Department on 08/26/20 for the symptoms described in the history of present illness. He/she was evaluated in the context of the global COVID-19 pandemic, which necessitated consideration that the patient might be at risk for infection with the SARS-CoV-2 virus that causes COVID-19. Institutional protocols and algorithms that pertain to the evaluation of patients at risk for COVID-19 are in a state of rapid change based on information released by regulatory bodies including the CDC and federal and state organizations. These policies and algorithms were followed during the patient's care in the ED.  25 year old male who tested positive for Covid last week presenting to the ED for continued sore throat that has worsened.  Had negative strep test last week but symptoms have progressed.  Patient reports pain with trying to fully open his mouth and with speaking.  On exam there is obvious left-sided swelling of the posterior pharynx.  He is tolerating his secretions.  He has normal range of motion of his neck.  CT confirms left-sided peritonsillar abscess that is 3.1 x 2.4 cm with  moderate effacement of the oropharyngeal airway.  Will consult ENT for further recommendations.  In the meantime he has received decadron.  Per ENT recommendations will treat with IV and p.o. clindamycin.  On recheck after Decadron patient is now able to eat and speak to me.  He states that he still has pain but he has improved.  He was given IV clindamycin here as well as pain medication.  Will discharge home with remainder of clindamycin course, ENT follow-up and pain medication.  Advised him that he needed to increase his fluid intake.  Return precautions given.   Patient is hemodynamically  stable, in NAD, and able to ambulate in the ED. Evaluation does not show pathology that would require ongoing emergent intervention or inpatient treatment. I explained the diagnosis to the patient. Pain has been managed and has no complaints prior to discharge. Patient is comfortable with above plan and is stable for discharge at this time. All questions were answered prior to disposition. Strict return precautions for returning to the ED were discussed. Encouraged follow up with PCP.   Prior to providing a prescription for a controlled substance, I independently reviewed the patient's recent prescription history on the [place] Controlled Substance Reporting System. The patient had no recent or regular prescriptions and was deemed appropriate for a brief, less than 3 day prescription of narcotic for acute analgesia.  An After Visit Summary was printed and given to the patient.   Portions of this note were generated with Scientist, clinical (histocompatibility and immunogenetics). Dictation errors may occur despite best attempts at proofreading.  Final Clinical Impression(s) / ED Diagnoses Final diagnoses:  Peritonsillar abscess    Rx / DC Orders ED Discharge Orders         Ordered    HYDROcodone-acetaminophen (HYCET) 7.5-325 mg/15 ml solution  4 times daily PRN        08/26/20 1624    clindamycin (CLEOCIN) 300 MG capsule  3 times  daily        08/26/20 1624           Dietrich Pates, PA-C 08/26/20 1805    Lorre Nick, MD 08/28/20 [phone]

## 2020-09-26 ENCOUNTER — Ambulatory Visit (HOSPITAL_COMMUNITY)
Admission: EM | Admit: 2020-09-26 | Discharge: 2020-09-26 | Disposition: A | Discharge status: Home / Self Care | Payer: Self-pay | Attending: Student | Admitting: Student

## 2020-09-26 ENCOUNTER — Other Ambulatory Visit: Payer: Self-pay

## 2020-09-26 ENCOUNTER — Encounter (HOSPITAL_COMMUNITY): Payer: Self-pay

## 2020-09-26 DIAGNOSIS — S39012A Strain of muscle, fascia and tendon of lower back, initial encounter: Secondary | ICD-10-CM

## 2020-09-26 DIAGNOSIS — S8000XA Contusion of unspecified knee, initial encounter: Secondary | ICD-10-CM

## 2020-09-26 MED ORDER — TIZANIDINE HCL 2 MG PO CAPS: 2.0000 mg | ORAL_CAPSULE | Freq: Three times a day (TID) | ORAL | 0 refills | Status: DC
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
## 2020-09-26 NOTE — Discharge Instructions (Addendum)
-  Take the muscle relaxer up to 3x daily for back muscle pain - Zanaflex (tizanidine). This can make you drowsy so make sure to take at night or when you don't have to drive.  -Come back and see [place] if your symptoms worsen instead of improve; new pain; new headaches/dizziness; etc.
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
## 2020-11-07 ENCOUNTER — Ambulatory Visit (HOSPITAL_COMMUNITY)
Admission: EM | Admit: 2020-11-07 | Discharge: 2020-11-07 | Disposition: A | Discharge status: Home / Self Care | Payer: Self-pay | Attending: Emergency Medicine | Admitting: Emergency Medicine

## 2020-11-07 ENCOUNTER — Other Ambulatory Visit: Payer: Self-pay

## 2020-11-07 DIAGNOSIS — Z20822 Contact with and (suspected) exposure to covid-19: Secondary | ICD-10-CM | POA: Insufficient documentation

## 2020-11-07 NOTE — ED Triage Notes (Signed)
Pt is here for a COVID test after being out of state. Pt denies sxs.

## 2020-11-08 LAB — SARS CORONAVIRUS 2 (TAT 6-24 HRS): SARS Coronavirus 2: NEGATIVE

## 2020-11-12 ENCOUNTER — Other Ambulatory Visit: Payer: Self-pay

## 2020-11-12 ENCOUNTER — Ambulatory Visit
Admission: EM | Admit: 2020-11-12 | Discharge: 2020-11-12 | Disposition: A | Discharge status: Home / Self Care | Payer: Self-pay | Attending: Internal Medicine | Admitting: Internal Medicine

## 2020-11-12 DIAGNOSIS — L02413 Cutaneous abscess of right upper limb: Secondary | ICD-10-CM

## 2020-11-12 MED ORDER — DOXYCYCLINE HYCLATE 100 MG PO CAPS: 100.0000 mg | ORAL_CAPSULE | Freq: Two times a day (BID) | ORAL | 0 refills | Status: AC

## 2020-11-12 MED ORDER — IBUPROFEN 600 MG PO TABS: 600.0000 mg | ORAL_TABLET | Freq: Four times a day (QID) | ORAL | 0 refills | Status: DC | PRN

## 2020-11-12 NOTE — Discharge Instructions (Signed)
Daily wound dressing changes with antibiotic ointment If you notice worsening swelling, increased purulence-please return to the urgent care to be reevaluated. Warm compress will help with abscess drainage.

## 2020-11-12 NOTE — ED Triage Notes (Signed)
Pt noticed a knot on his right elbow after working last night. Painful and swollen area on right elbow

## 2020-11-12 NOTE — ED Provider Notes (Signed)
Rick Cooper    CSN: [id number] Arrival date & time: 11/12/20  0955      History   Chief Complaint Chief Complaint  Patient presents with  . Abscess    HPI Rick Cooper is a 25 y.o. male comes to the urgent [facility] with a 1 day history of painful swelling over the right elbow.  Patient noticed the swelling yesterday.  Pain is of moderate severity.  No trauma to the right elbow.  Mild erythema over the swelling.  No fever or chills.   HPI  No past medical history on file.  There are no problems to display for this patient.   No past surgical history on file.     Home Medications    Prior to Admission medications   Medication Sig Start Date End Date Taking? Authorizing Provider  doxycycline (VIBRAMYCIN) 100 MG capsule Take 1 capsule (100 mg total) by mouth 2 (two) times daily for 7 days. 11/12/20 11/19/20 Yes Yaniel Limbaugh, Britta Mccreedy, MD  ibuprofen (ADVIL) 600 MG tablet Take 1 tablet (600 mg total) by mouth every 6 (six) hours as needed. 11/12/20  Yes Sabryna Lahm, Britta Mccreedy, MD  HYDROcodone-acetaminophen (HYCET) 7.5-325 mg/15 ml solution Take 15 mLs by mouth 4 (four) times daily as needed for moderate pain. 08/26/20 08/26/21  Khatri, Hina, PA-C  tizanidine (ZANAFLEX) 2 MG capsule Take 1 capsule (2 mg total) by mouth 3 (three) times daily. 09/26/20   Rhys Martini, PA-C    Family History Family History  Family history unknown: Yes    Social History Social History   Tobacco Use  . Smoking status: Never Smoker  . Smokeless tobacco: Never Used  Substance Use Topics  . Alcohol use: Yes    Comment: occas  . Drug use: Yes    Types: Marijuana     Allergies   Patient has no known allergies.   Review of Systems Review of Systems  Gastrointestinal: Negative.   Musculoskeletal: Negative for arthralgias and myalgias.  Skin: Positive for color change and wound.  Neurological: Negative.      Physical Exam Triage Vital Signs ED Triage Vitals  Enc Vitals Group     BP  11/12/20 1002 119/64     Pulse Rate 11/12/20 1002 92     Resp 11/12/20 1002 16     Temp 11/12/20 1002 98.6 F (37 C)     Temp Source 11/12/20 1002 Oral     SpO2 11/12/20 1002 96 %     Weight --      Height --      Head Circumference --      Peak Flow --      Pain Score 11/12/20 1001 7     Pain Loc --      Pain Edu? --      Excl. in GC? --    No data found.  Updated Vital Signs BP 119/64 (BP Location: Right Arm)   Pulse 92   Temp 98.6 F (37 C) (Oral)   Resp 16   SpO2 96%   Visual Acuity Right Eye Distance:   Left Eye Distance:   Bilateral Distance:    Right Eye Near:   Left Eye Near:    Bilateral Near:     Physical Exam Vitals and nursing note reviewed.  Cardiovascular:     Rate and Rhythm: Normal rate and regular rhythm.  Skin:    General: Skin is warm.     Comments: Tender, fluctuant, swelling over the right  olecranon process.  Minimal erythema.      UC Treatments / Results  Labs (all labs ordered are listed, but only abnormal results are displayed) Labs Reviewed - No data to display  EKG   Radiology No results found.  Procedures Incision and Drainage  Date/Time: 11/12/2020 1:57 PM Performed by: Merrilee Jansky, MD Authorized by: Merrilee Jansky, MD   Consent:    Consent obtained:  Verbal   Risks discussed:  Bleeding and incomplete drainage Universal protocol:    Patient identity confirmed:  Verbally with patient Location:    Type:  Bursa   Location:  Upper extremity   Upper extremity location:  Elbow   Elbow location:  R elbow Pre-procedure details:    Skin preparation:  Povidone-iodine Anesthesia:    Anesthesia method:  Local infiltration   Local anesthetic:  Lidocaine 2% w/o epi Procedure details:    Incision types:  Stab incision   Drainage:  Bloody and purulent   Drainage amount:  Scant Post-procedure details:    Procedure completion:  Tolerated well, no immediate complications   (including critical Cooper  time)  Medications Ordered in UC Medications - No data to display  Initial Impression / Assessment and Plan / UC Course  I have reviewed the triage vital signs and the nursing notes.  Pertinent labs & imaging results that were available during my Cooper of the patient were reviewed by me and considered in my medical decision making (see chart for details).     1.  Right elbow abscess: Incision and drainage completed Doxycycline 100 mg twice daily for 7 days Ibuprofen 600 mg every 6 hours as needed Warm compress recommended Daily wound dressing changes recommended If patient experiences worsening pain, swelling or discharge, patient is advised to return to the urgent [facility] to be reevaluated.   Final Clinical Impressions(s) / UC Diagnoses   Final diagnoses:  Abscess of right elbow     Discharge Instructions     Daily wound dressing changes with antibiotic ointment If you notice worsening swelling, increased purulence-please return to the urgent [facility] to be reevaluated. Warm compress will help with abscess drainage.   ED Prescriptions    Medication Sig Dispense Auth. Provider   doxycycline (VIBRAMYCIN) 100 MG capsule Take 1 capsule (100 mg total) by mouth 2 (two) times daily for 7 days. 14 capsule Rosalynn Sergent, Britta Mccreedy, MD   ibuprofen (ADVIL) 600 MG tablet Take 1 tablet (600 mg total) by mouth every 6 (six) hours as needed. 30 tablet Wilbur Labuda, Britta Mccreedy, MD     PDMP not reviewed this encounter.   Merrilee Jansky, MD 11/12/20 1359

## 2020-12-03 ENCOUNTER — Other Ambulatory Visit: Payer: Self-pay

## 2020-12-03 ENCOUNTER — Ambulatory Visit (HOSPITAL_COMMUNITY)
Admission: EM | Admit: 2020-12-03 | Discharge: 2020-12-03 | Disposition: A | Discharge status: Home / Self Care | Payer: Self-pay | Attending: Internal Medicine | Admitting: Internal Medicine

## 2020-12-03 ENCOUNTER — Encounter (HOSPITAL_COMMUNITY): Payer: Self-pay | Admitting: *Deleted

## 2020-12-03 ENCOUNTER — Ambulatory Visit (INDEPENDENT_AMBULATORY_CARE_PROVIDER_SITE_OTHER): Payer: Self-pay

## 2020-12-03 DIAGNOSIS — W231XXA Caught, crushed, jammed, or pinched between stationary objects, initial encounter: Secondary | ICD-10-CM

## 2020-12-03 DIAGNOSIS — S60041A Contusion of right ring finger without damage to nail, initial encounter: Secondary | ICD-10-CM

## 2020-12-03 DIAGNOSIS — M79644 Pain in right finger(s): Secondary | ICD-10-CM

## 2020-12-03 DIAGNOSIS — S6991XA Unspecified injury of right wrist, hand and finger(s), initial encounter: Secondary | ICD-10-CM

## 2020-12-03 MED ORDER — IBUPROFEN 800 MG PO TABS: 800.0000 mg | ORAL_TABLET | Freq: Three times a day (TID) | ORAL | 0 refills | Status: DC

## 2020-12-03 MED ORDER — NAPROXEN 500 MG PO TABS: 500.0000 mg | ORAL_TABLET | Freq: Two times a day (BID) | ORAL | 0 refills | Status: DC

## 2020-12-03 NOTE — ED Provider Notes (Signed)
[facility]    CSN: [id number] Arrival date & time: 12/03/20  1437      History   Chief Complaint Chief Complaint  Patient presents with  . Finger Injury    HPI Rick Cooper is a 25 y.o. male.   Patient here for evaluation of right ring finger injury.  Reports slamming finger in door of the truck earlier today.  Does report significant pain and pressure to right ring fingertip.  Bruising noted.  Patient is able to move finger.  Not taking any OTC medications or treatments.  Denies any fevers, chest pain, shortness of breath, N/V/D, numbness, tingling, weakness, abdominal pain, or headaches.    ROS: As per HPI, all other pertinent ROS negative  The history is provided by the patient.    History reviewed. No pertinent past medical history.  There are no problems to display for this patient.   History reviewed. No pertinent surgical history.     Home Medications    Prior to Admission medications   Medication Sig Start Date End Date Taking? Authorizing Provider  ibuprofen (ADVIL) 800 MG tablet Take 1 tablet (800 mg total) by mouth 3 (three) times daily. 12/03/20  Yes Ivette Loyal, NP  naproxen (NAPROSYN) 500 MG tablet Take 1 tablet (500 mg total) by mouth 2 (two) times daily. 12/03/20  Yes Ivette Loyal, NP  HYDROcodone-acetaminophen (HYCET) 7.5-325 mg/15 ml solution Take 15 mLs by mouth 4 (four) times daily as needed for moderate pain. 08/26/20 08/26/21  Khatri, Hina, PA-C  tizanidine (ZANAFLEX) 2 MG capsule Take 1 capsule (2 mg total) by mouth 3 (three) times daily. 09/26/20   Rhys Martini, PA-C    Family History Family History  Family history unknown: Yes    Social History Social History   Tobacco Use  . Smoking status: Never Smoker  . Smokeless tobacco: Never Used  Substance Use Topics  . Alcohol use: Yes    Comment: occas  . Drug use: Yes    Types: Marijuana     Allergies   Patient has no known allergies.   Review of Systems Review  of Systems  Musculoskeletal: Positive for joint swelling.  All other systems reviewed and are negative.    Physical Exam Triage Vital Signs ED Triage Vitals  Enc Vitals Group     BP 12/03/20 1512 130/82     Pulse Rate 12/03/20 1512 61     Resp 12/03/20 1512 18     Temp --      Temp Source 12/03/20 1512 Oral     SpO2 12/03/20 1512 100 %     Weight --      Height --      Head Circumference --      Peak Flow --      Pain Score 12/03/20 1511 3     Pain Loc --      Pain Edu? --      Excl. in GC? --    No data found.  Updated Vital Signs BP 130/82 (BP Location: Right Arm)   Pulse 61   Resp 18   SpO2 100%   Visual Acuity Right Eye Distance:   Left Eye Distance:   Bilateral Distance:    Right Eye Near:   Left Eye Near:    Bilateral Near:     Physical Exam Vitals and nursing note reviewed.  Constitutional:      General: He is not in acute distress.    Appearance: Normal  appearance. He is not ill-appearing, toxic-appearing or diaphoretic.  HENT:     Head: Normocephalic and atraumatic.  Eyes:     Conjunctiva/sclera: Conjunctivae normal.  Cardiovascular:     Rate and Rhythm: Normal rate.     Pulses: Normal pulses.  Pulmonary:     Effort: Pulmonary effort is normal.  Abdominal:     General: Abdomen is flat.  Musculoskeletal:        General: Normal range of motion.     Right hand: Tenderness (ecchymosis, bruising, and tenderness to right ring finger tip, cap refill normal) present. Normal pulse.     Cervical back: Normal range of motion.  Skin:    General: Skin is warm and dry.  Neurological:     General: No focal deficit present.     Mental Status: He is alert and oriented to person, place, and time.  Psychiatric:        Mood and Affect: Mood normal.      UC Treatments / Results  Labs (all labs ordered are listed, but only abnormal results are displayed) Labs Reviewed - No data to display  EKG   Radiology DG Finger Ring Right  Result Date:  12/03/2020 CLINICAL DATA:  Slammed finger in car door. EXAM: RIGHT RING FINGER 2+V COMPARISON:  None. FINDINGS: There is no evidence of fracture or dislocation. There is no evidence of arthropathy or other focal bone abnormality. Soft tissues are unremarkable. IMPRESSION: Negative radiographs of the fourth finger in the right hand. Electronically Signed   By: Emmaline Kluver M.D.   On: 12/03/2020 15:25    Procedures Procedures (including critical care time)  Medications Ordered in UC Medications - No data to display  Initial Impression / Assessment and Plan / UC Course  I have reviewed the triage vital signs and the nursing notes.  Pertinent labs & imaging results that were available during my care of the patient were reviewed by me and considered in my medical decision making (see chart for details).     Contusion of right ring finger, injury to right finger Assessment negative for red flags or concerns.  X-ray with no signs of fractures or bony abnormalities.  Recommend NSAIDs for pain relief and RICE for comfort.  Naproxen twice daily and ibuprofen 3 times daily prescribed.  Follow-up if symptoms do not improve or worsen over the next few days.  Final Clinical Impressions(s) / UC Diagnoses   Final diagnoses:  Injury of finger of right hand, initial encounter  Contusion of right ring finger without damage to nail, initial encounter     Discharge Instructions     Take either the ibuprofen or the naproxen as needed for pain.  You can also take Tylenol with either of those medications.  Apply ice for 10 to 15 minutes every 4-6 hours as needed for comfort.  You can apply a finger splint as needed for comfort.      ED Prescriptions    Medication Sig Dispense Auth. Provider   ibuprofen (ADVIL) 800 MG tablet Take 1 tablet (800 mg total) by mouth 3 (three) times daily. 21 tablet Chales Salmon R, NP   naproxen (NAPROSYN) 500 MG tablet Take 1 tablet (500 mg total) by mouth 2 (two)  times daily. 30 tablet Ivette Loyal, NP     PDMP not reviewed this encounter.   Ivette Loyal, NP 12/03/20 [phone]

## 2020-12-03 NOTE — Discharge Instructions (Addendum)
Take either the ibuprofen or the naproxen as needed for pain.  You can also take Tylenol with either of those medications.  Apply ice for 10 to 15 minutes every 4-6 hours as needed for comfort.  You can apply a finger splint as needed for comfort.

## 2020-12-03 NOTE — ED Triage Notes (Addendum)
PT closed the truck door on his Rt ring  Finger today.

## 2021-01-17 ENCOUNTER — Encounter (HOSPITAL_COMMUNITY): Payer: Self-pay | Admitting: Emergency Medicine

## 2021-01-17 ENCOUNTER — Ambulatory Visit (HOSPITAL_COMMUNITY)
Admission: EM | Admit: 2021-01-17 | Discharge: 2021-01-17 | Disposition: A | Discharge status: Home / Self Care | Payer: Self-pay | Attending: Student | Admitting: Student

## 2021-01-17 DIAGNOSIS — J029 Acute pharyngitis, unspecified: Secondary | ICD-10-CM | POA: Insufficient documentation

## 2021-01-17 DIAGNOSIS — J301 Allergic rhinitis due to pollen: Secondary | ICD-10-CM | POA: Insufficient documentation

## 2021-01-17 DIAGNOSIS — Z1152 Encounter for screening for COVID-19: Secondary | ICD-10-CM | POA: Insufficient documentation

## 2021-01-17 LAB — POCT RAPID STREP A, ED / UC: Streptococcus, Group A Screen (Direct): NEGATIVE

## 2021-01-17 MED ORDER — LIDOCAINE VISCOUS HCL 2 % MT SOLN: 15.0000 mL | OROMUCOSAL | 0 refills | Status: DC | PRN

## 2021-01-17 MED ORDER — PREDNISONE 20 MG PO TABS: 20.0000 mg | ORAL_TABLET | Freq: Every day | ORAL | 0 refills | Status: AC

## 2021-01-17 NOTE — ED Triage Notes (Signed)
Pt presents with sore throat xs 3 days.  

## 2021-01-17 NOTE — ED Triage Notes (Signed)
Pt called on Tel. # listed by Pt . No answer from Pt.

## 2021-01-17 NOTE — ED Provider Notes (Signed)
[facility]    CSN: [id number] Arrival date & time: 01/17/21  1037      History   Chief Complaint Chief Complaint  Patient presents with  . Sore Throat    HPI Rick Cooper is a 25 y.o. male presenting with 3 days of sore throat and nasal congestion.  Medical history allergic rhinitis for which he takes Benadryl as needed.  Denies other symptoms including fever/chills, cough, trouble swallowing, shortness of breath, dizziness, body aches, nausea/vomiting/diarrhea. This patient does have a history of peritonsillar abscess 1/22.  HPI  History reviewed. No pertinent past medical history.  There are no problems to display for this patient.   History reviewed. No pertinent surgical history.     Home Medications    Prior to Admission medications   Medication Sig Start Date End Date Taking? Authorizing Provider  lidocaine (XYLOCAINE) 2 % solution Use as directed 15 mLs in the mouth or throat as needed for mouth pain. 01/17/21  Yes Rhys Martini, PA-C  predniSONE (DELTASONE) 20 MG tablet Take 1 tablet (20 mg total) by mouth daily for 5 days. 01/17/21 01/22/21 Yes Rhys Martini, PA-C  HYDROcodone-acetaminophen (HYCET) 7.5-325 mg/15 ml solution Take 15 mLs by mouth 4 (four) times daily as needed for moderate pain. 08/26/20 08/26/21  Khatri, Hina, PA-C  ibuprofen (ADVIL) 800 MG tablet Take 1 tablet (800 mg total) by mouth 3 (three) times daily. 12/03/20   Ivette Loyal, NP  naproxen (NAPROSYN) 500 MG tablet Take 1 tablet (500 mg total) by mouth 2 (two) times daily. 12/03/20   Ivette Loyal, NP  tizanidine (ZANAFLEX) 2 MG capsule Take 1 capsule (2 mg total) by mouth 3 (three) times daily. 09/26/20   Rhys Martini, PA-C    Family History Family History  Family history unknown: Yes    Social History Social History   Tobacco Use  . Smoking status: Never Smoker  . Smokeless tobacco: Never Used  Substance Use Topics  . Alcohol use: Yes    Comment: occas  . Drug use:  Yes    Types: Marijuana     Allergies   Patient has no known allergies.   Review of Systems Review of Systems  Constitutional: Negative for appetite change, chills and fever.  HENT: Positive for sore throat. Negative for congestion, ear pain, rhinorrhea, sinus pressure, sinus pain, trouble swallowing and voice change.   Eyes: Negative for redness and visual disturbance.  Respiratory: Negative for cough, chest tightness, shortness of breath and wheezing.   Cardiovascular: Negative for chest pain and palpitations.  Gastrointestinal: Negative for abdominal pain, constipation, diarrhea, nausea and vomiting.  Genitourinary: Negative for dysuria, frequency and urgency.  Musculoskeletal: Negative for myalgias.  Neurological: Negative for dizziness, weakness and headaches.  Psychiatric/Behavioral: Negative for confusion.  All other systems reviewed and are negative.    Physical Exam Triage Vital Signs ED Triage Vitals  Enc Vitals Group     BP 01/17/21 1204 112/77     Pulse Rate 01/17/21 1204 75     Resp 01/17/21 1204 16     Temp 01/17/21 1204 98.2 F (36.8 C)     Temp Source 01/17/21 1204 Oral     SpO2 01/17/21 1204 98 %     Weight --      Height --      Head Circumference --      Peak Flow --      Pain Score 01/17/21 1202 0     Pain Loc --  Pain Edu? --      Excl. in [place]? --    No data found.  Updated Vital Signs BP 112/77 (BP Location: Right Arm)   Pulse 75   Temp 98.2 F (36.8 C) (Oral)   Resp 16   SpO2 98%   Visual Acuity Right Eye Distance:   Left Eye Distance:   Bilateral Distance:    Right Eye Near:   Left Eye Near:    Bilateral Near:     Physical Exam Vitals reviewed.  Constitutional:      General: He is not in acute distress.    Appearance: Normal appearance. He is not ill-appearing.  HENT:     Head: Normocephalic and atraumatic.     Right Ear: Hearing, tympanic membrane, ear canal and external ear normal. No swelling or tenderness. There is  no impacted cerumen. No mastoid tenderness. Tympanic membrane is not perforated, erythematous, retracted or bulging.     Left Ear: Hearing, tympanic membrane, ear canal and external ear normal. No swelling or tenderness. There is no impacted cerumen. No mastoid tenderness. Tympanic membrane is not perforated, erythematous, retracted or bulging.     Nose:     Right Sinus: No maxillary sinus tenderness or frontal sinus tenderness.     Left Sinus: No maxillary sinus tenderness or frontal sinus tenderness.     Mouth/Throat:     Mouth: Mucous membranes are moist.     Pharynx: Uvula midline. Posterior oropharyngeal erythema present. No oropharyngeal exudate.     Tonsils: No tonsillar exudate. 2+ on the right. 2+ on the left.     Comments: Smooth erythema posterior pharynx  On exam, uvula is midline, he is tolerating secretions without difficulty, there is no trismus, no drooling, he has normal phonation  Cardiovascular:     Rate and Rhythm: Normal rate and regular rhythm.     Heart sounds: Normal heart sounds.  Pulmonary:     Breath sounds: Normal breath sounds and air entry. No wheezing, rhonchi or rales.  Chest:     Chest wall: No tenderness.  Abdominal:     General: Abdomen is flat. Bowel sounds are normal.     Tenderness: There is no abdominal tenderness. There is no guarding or rebound.  Lymphadenopathy:     Cervical: No cervical adenopathy.  Skin:    Capillary Refill: Capillary refill takes less than 2 seconds.  Neurological:     General: No focal deficit present.     Mental Status: He is alert and oriented to person, place, and time.  Psychiatric:        Attention and Perception: Attention and perception normal.        Mood and Affect: Mood and affect normal.        Behavior: Behavior normal. Behavior is cooperative.        Thought Content: Thought content normal.        Judgment: Judgment normal.      UC Treatments / Results  Labs (all labs ordered are listed, but only  abnormal results are displayed) Labs Reviewed  CULTURE, GROUP A STREP ([facility])  SARS CORONAVIRUS 2 (TAT 6-24 HRS)  POCT RAPID STREP A, ED / UC    EKG   Radiology No results found.  Procedures Procedures (including critical care time)  Medications Ordered in UC Medications - No data to display  Initial Impression / Assessment and Plan / UC Course  I have reviewed the triage vital signs and the nursing notes.  Pertinent labs &  imaging results that were available during my care of the patient were reviewed by me and considered in my medical decision making (see chart for details).     This patient is a 25 year old male presenting with pharyngitis.  Suspect this is related to due to untreated allergic rhinitis. Today this pt is afebrile nontachycardic nontachypneic, oxygenating well on room air, no wheezes rhonchi or rales.   Rapid strep negative, culture sent. Covid PCR sent.   This patient does have a history of peritonsillar abscess 08/2020. Today, on exam, uvula is midline, he is tolerating secretions without difficulty, there is no trismus, no drooling, he has normal phonation.  Prednisone and viscous lidocaine as below. Trial of zyrtec or benedryl.  ED return precautions discussed.  Final Clinical Impressions(s) / UC Diagnoses   Final diagnoses:  Acute pharyngitis, unspecified etiology  Seasonal allergic rhinitis due to pollen  Encounter for screening for COVID-19     Discharge Instructions     -Prednisone, one pill taken daily for 5 days in a row.  Try taking this earlier in the day as it can give you energy.  Try to avoid ibuprofen while taking this medication as it can increase your chance of stomach upset and gastritis. -For sore throat, use lidocaine mouthwash up to every 4 hours. Make sure not to eat for at least 1 hour after using this, as your mouth will be very numb and you could bite yourself. -For additional relief, you can try tylenol -You can also try  daily Zyrtec for allergic rhinitis component. -Seek additional medical attention if you develop new symptoms like trouble swallowing, shortness of breath, dizziness.     ED Prescriptions    Medication Sig Dispense Auth. Provider   predniSONE (DELTASONE) 20 MG tablet Take 1 tablet (20 mg total) by mouth daily for 5 days. 5 tablet Ignacia Bayley E, PA-C   lidocaine (XYLOCAINE) 2 % solution Use as directed 15 mLs in the mouth or throat as needed for mouth pain. 100 mL Rhys Martini, PA-C     PDMP not reviewed this encounter.   Rhys Martini, PA-C 01/17/21 1239

## 2021-01-17 NOTE — Discharge Instructions (Addendum)
-  Prednisone, one pill taken daily for 5 days in a row.  Try taking this earlier in the day as it can give you energy.  Try to avoid ibuprofen while taking this medication as it can increase your chance of stomach upset and gastritis. -For sore throat, use lidocaine mouthwash up to every 4 hours. Make sure not to eat for at least 1 hour after using this, as your mouth will be very numb and you could bite yourself. -For additional relief, you can try tylenol -You can also try daily Zyrtec for allergic rhinitis component. -Seek additional medical attention if you develop new symptoms like trouble swallowing, shortness of breath, dizziness.

## 2021-01-18 LAB — SARS CORONAVIRUS 2 (TAT 6-24 HRS): SARS Coronavirus 2: NEGATIVE

## 2021-01-20 LAB — CULTURE, GROUP A STREP (THRC)

## 2021-03-15 ENCOUNTER — Encounter (HOSPITAL_COMMUNITY): Payer: Self-pay

## 2021-03-15 ENCOUNTER — Other Ambulatory Visit: Payer: Self-pay

## 2021-03-15 ENCOUNTER — Ambulatory Visit (HOSPITAL_COMMUNITY)
Admission: EM | Admit: 2021-03-15 | Discharge: 2021-03-15 | Disposition: A | Discharge status: Home / Self Care | Payer: Self-pay | Attending: Emergency Medicine | Admitting: Emergency Medicine

## 2021-03-15 DIAGNOSIS — S39012A Strain of muscle, fascia and tendon of lower back, initial encounter: Secondary | ICD-10-CM

## 2021-03-15 LAB — POCT URINALYSIS DIPSTICK, ED / UC
Bilirubin Urine: NEGATIVE
Glucose, UA: NEGATIVE mg/dL
Hgb urine dipstick: NEGATIVE
Ketones, ur: NEGATIVE mg/dL
Leukocytes,Ua: NEGATIVE
Nitrite: NEGATIVE
Protein, ur: NEGATIVE mg/dL
Specific Gravity, Urine: >=1.03 (ref 1.005–1.030)
Urobilinogen, UA: 0.2 mg/dL (ref 0.0–1.0)
pH: 6.5 (ref 5.0–8.0)

## 2021-03-15 MED ORDER — IBUPROFEN 800 MG PO TABS: 800.0000 mg | ORAL_TABLET | Freq: Three times a day (TID) | ORAL | 0 refills | Status: DC

## 2021-03-15 NOTE — ED Triage Notes (Signed)
Pt presents with back pain X 1 week; pt states he does heavy lifting.

## 2021-03-15 NOTE — ED Provider Notes (Signed)
[facility]    CSN: [id number] Arrival date & time: 03/15/21  1251      History   Chief Complaint Chief Complaint  Patient presents with   Back Pain    HPI Rick Cooper is a 25 y.o. male.   Patient here for evaluation of left sided lower back pain that has been ongoing for the past week.  Reports pain started while he was at work.  Reports a lot of walking and heavy lifting.  Denies any hematuria, dysuria, or frequency.  Reports pain is constant and worse with movement.  Has not tried any OTC medications or treatments.  Denies any fevers, chest pain, shortness of breath, N/V/D, numbness, tingling, weakness, abdominal pain, or headaches.     The history is provided by the patient.  Back Pain Associated symptoms: no dysuria    History reviewed. No pertinent past medical history.  There are no problems to display for this patient.   History reviewed. No pertinent surgical history.     Home Medications    Prior to Admission medications   Medication Sig Start Date End Date Taking? Authorizing Provider  ibuprofen (ADVIL) 800 MG tablet Take 1 tablet (800 mg total) by mouth 3 (three) times daily. 03/15/21  Yes Ivette Loyal, NP  HYDROcodone-acetaminophen (HYCET) 7.5-325 mg/15 ml solution Take 15 mLs by mouth 4 (four) times daily as needed for moderate pain. 08/26/20 08/26/21  Khatri, Hina, PA-C  lidocaine (XYLOCAINE) 2 % solution Use as directed 15 mLs in the mouth or throat as needed for mouth pain. 01/17/21   Rhys Martini, PA-C  tizanidine (ZANAFLEX) 2 MG capsule Take 1 capsule (2 mg total) by mouth 3 (three) times daily. 09/26/20   Rhys Martini, PA-C    Family History Family History  Family history unknown: Yes    Social History Social History   Tobacco Use   Smoking status: Never   Smokeless tobacco: Never  Substance Use Topics   Alcohol use: Yes    Comment: occas   Drug use: Yes    Types: Marijuana     Allergies   Patient has no known  allergies.   Review of Systems Review of Systems  Genitourinary:  Negative for dysuria, hematuria and urgency.  Musculoskeletal:  Positive for back pain.  All other systems reviewed and are negative.   Physical Exam Triage Vital Signs ED Triage Vitals  Enc Vitals Group     BP 03/15/21 1337 112/60     Pulse Rate 03/15/21 1337 86     Resp 03/15/21 1337 18     Temp 03/15/21 1337 98 F (36.7 C)     Temp Source 03/15/21 1337 Oral     SpO2 03/15/21 1337 98 %     Weight --      Height --      Head Circumference --      Peak Flow --      Pain Score 03/15/21 1336 8     Pain Loc --      Pain Edu? --      Excl. in [place]? --    No data found.  Updated Vital Signs BP 112/60 (BP Location: Right Arm)   Pulse 86   Temp 98 F (36.7 C) (Oral)   Resp 18   SpO2 98%   Visual Acuity Right Eye Distance:   Left Eye Distance:   Bilateral Distance:    Right Eye Near:   Left Eye Near:  Bilateral Near:     Physical Exam Vitals and nursing note reviewed.  Constitutional:      General: He is not in acute distress.    Appearance: Normal appearance. He is not ill-appearing, toxic-appearing or diaphoretic.  HENT:     Head: Normocephalic and atraumatic.  Eyes:     Conjunctiva/sclera: Conjunctivae normal.  Cardiovascular:     Rate and Rhythm: Normal rate.     Pulses: Normal pulses.  Pulmonary:     Effort: Pulmonary effort is normal.  Abdominal:     General: Abdomen is flat.     Tenderness: There is no right CVA tenderness or left CVA tenderness.  Musculoskeletal:        General: Normal range of motion.     Cervical back: Normal range of motion.     Lumbar back: Tenderness (left lower back) present. No swelling or bony tenderness.  Skin:    General: Skin is warm and dry.  Neurological:     General: No focal deficit present.     Mental Status: He is alert and oriented to person, place, and time.  Psychiatric:        Mood and Affect: Mood normal.     UC Treatments / Results   Labs (all labs ordered are listed, but only abnormal results are displayed) Labs Reviewed  POCT URINALYSIS DIPSTICK, ED / UC    EKG   Radiology No results found.  Procedures Procedures (including critical care time)  Medications Ordered in UC Medications - No data to display  Initial Impression / Assessment and Plan / UC Course  I have reviewed the triage vital signs and the nursing notes.  Pertinent labs & imaging results that were available during my care of the patient were reviewed by me and considered in my medical decision making (see chart for details).    Assessment negative for red flags or concerns.  Urinalysis with no signs of infection or renal colic.  Likely lumbar strain.  Will treat with Ibuprofen as needed for pain.  Recommend head, ice, or alternate between heat and ice for comfort.  Encouraged fluids and rest.  Given gentle stretching and exercises and information on good body mechanics.  Follow up with primary care as needed.  Final Clinical Impressions(s) / UC Diagnoses   Final diagnoses:  Strain of lumbar region, initial encounter     Discharge Instructions      Take the Ibuprofen as needed for pain. You can also take Tylenol.  Rest and drink plenty of fluids.  Do gentle stretching and exercises.    If you symptoms do not improve, follow up with your primary care or with an orthopedic doctor.   Return or go to the Emergency Department if symptoms worsen or do not improve in the next few days.      ED Prescriptions     Medication Sig Dispense Auth. Provider   ibuprofen (ADVIL) 800 MG tablet Take 1 tablet (800 mg total) by mouth 3 (three) times daily. 21 tablet Ivette Loyal, NP      PDMP not reviewed this encounter.   Ivette Loyal, NP 03/15/21 1429

## 2021-03-15 NOTE — Discharge Instructions (Addendum)
Take the Ibuprofen as needed for pain. You can also take Tylenol.  Rest and drink plenty of fluids.  Do gentle stretching and exercises.    If you symptoms do not improve, follow up with your primary care or with an orthopedic doctor.   Return or go to the Emergency Department if symptoms worsen or do not improve in the next few days.

## 2021-07-21 ENCOUNTER — Other Ambulatory Visit: Payer: Self-pay

## 2021-07-21 ENCOUNTER — Ambulatory Visit (HOSPITAL_COMMUNITY)
Admission: EM | Admit: 2021-07-21 | Discharge: 2021-07-21 | Disposition: A | Discharge status: Home / Self Care | Payer: Self-pay | Attending: Internal Medicine | Admitting: Internal Medicine

## 2021-07-21 DIAGNOSIS — Z1152 Encounter for screening for COVID-19: Secondary | ICD-10-CM

## 2021-07-21 DIAGNOSIS — Z01812 Encounter for preprocedural laboratory examination: Secondary | ICD-10-CM | POA: Insufficient documentation

## 2021-07-21 DIAGNOSIS — Z0189 Encounter for other specified special examinations: Secondary | ICD-10-CM

## 2021-07-21 DIAGNOSIS — Z20822 Contact with and (suspected) exposure to covid-19: Secondary | ICD-10-CM | POA: Insufficient documentation

## 2021-07-21 LAB — SARS CORONAVIRUS 2 (TAT 6-24 HRS): SARS Coronavirus 2: NEGATIVE

## 2021-07-21 NOTE — ED Triage Notes (Signed)
Pt presents to the office for covid testing, no symptoms.

## 2022-01-13 ENCOUNTER — Ambulatory Visit (HOSPITAL_COMMUNITY)
Admission: EM | Admit: 2022-01-13 | Discharge: 2022-01-13 | Disposition: A | Discharge status: Home / Self Care | Payer: Self-pay | Attending: Emergency Medicine | Admitting: Emergency Medicine

## 2022-01-13 ENCOUNTER — Encounter (HOSPITAL_COMMUNITY): Payer: Self-pay | Admitting: *Deleted

## 2022-01-13 ENCOUNTER — Other Ambulatory Visit: Payer: Self-pay

## 2022-01-13 DIAGNOSIS — M545 Low back pain, unspecified: Secondary | ICD-10-CM

## 2022-01-13 DIAGNOSIS — M546 Pain in thoracic spine: Secondary | ICD-10-CM

## 2022-01-13 MED ORDER — NAPROXEN 500 MG PO TABS: 500.0000 mg | ORAL_TABLET | Freq: Two times a day (BID) | ORAL | 0 refills | Status: DC

## 2022-01-13 MED ORDER — CYCLOBENZAPRINE HCL 10 MG PO TABS: 10.0000 mg | ORAL_TABLET | Freq: Two times a day (BID) | ORAL | 0 refills | Status: DC | PRN

## 2022-01-13 NOTE — ED Triage Notes (Signed)
Reports back pain to mid and lower back that started 2 days ago.

## 2022-01-13 NOTE — ED Provider Notes (Signed)
[facility]    CSN: [id number] Arrival date & time: 01/13/22  1448      History   Chief Complaint Chief Complaint  Patient presents with   Back Pain    HPI Rick Cooper is a 26 y.o. male.   Patient presents with bilateral low back pain and centralized back pain beginning 2 days ago after work.  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  Pain does not radiate.  Range of motion intact but elicits pain with twisting and turning.  Has not attempted treatment of symptoms.  Denies numbness, tingling, prior injury or trauma, urinary or bowel incontinence.  No pertinent medical history.    History reviewed. No pertinent past medical history.  There are no problems to display for this patient.   History reviewed. No pertinent surgical history.     Home Medications    Prior to Admission medications   Medication Sig Start Date End Date Taking? Authorizing Provider  ibuprofen (ADVIL) 800 MG tablet Take 1 tablet (800 mg total) by mouth 3 (three) times daily. 03/15/21   Pearson Forster, NP  lidocaine (XYLOCAINE) 2 % solution Use as directed 15 mLs in the mouth or throat as needed for mouth pain. 01/17/21   Hazel Sams, PA-C  tizanidine (ZANAFLEX) 2 MG capsule Take 1 capsule (2 mg total) by mouth 3 (three) times daily. 09/26/20   Hazel Sams, PA-C    Family History Family History  Family history unknown: Yes    Social History Social History   Tobacco Use   Smoking status: Never   Smokeless tobacco: Never  Substance Use Topics   Alcohol use: Yes    Comment: occas   Drug use: Yes    Types: Marijuana     Allergies   Patient has no known allergies.   Review of Systems Review of Systems  Constitutional: Negative.   Respiratory: Negative.    Cardiovascular: Negative.   Musculoskeletal:  Positive for back pain. Negative for arthralgias, gait problem, joint swelling, myalgias, neck pain and neck stiffness.   Skin: Negative.     Physical Exam Triage Vital Signs ED Triage Vitals  Enc Vitals Group     BP 01/13/22 1624 103/66     Pulse Rate 01/13/22 1624 68     Resp 01/13/22 1624 18     Temp 01/13/22 1624 98.3 F (36.8 C)     Temp src --      SpO2 01/13/22 1624 98 %     Weight --      Height --      Head Circumference --      Peak Flow --      Pain Score 01/13/22 1622 9     Pain Loc --      Pain Edu? --      Excl. in [place]? --    No data found.  Updated Vital Signs BP 103/66   Pulse 68   Temp 98.3 F (36.8 C)   Resp 18   SpO2 98%   Visual Acuity Right Eye Distance:   Left Eye Distance:   Bilateral Distance:    Right Eye Near:   Left Eye Near:    Bilateral Near:     Physical Exam Constitutional:      Appearance: Normal appearance.  HENT:     Head: Normocephalic.  Eyes:     Extraocular Movements: Extraocular movements intact.  Pulmonary:  Effort: Pulmonary effort is normal.  Musculoskeletal:     Comments: Tenderness is present over the bilateral lumbar region and in the midline of the thoracic region without swelling, ecchymosis or deformity noted, range of motion intact, able to walk to the exam room without assistance  Neurological:     Mental Status: He is alert and oriented to person, place, and time. Mental status is at baseline.  Psychiatric:        Mood and Affect: Mood normal.        Behavior: Behavior normal.     UC Treatments / Results  Labs (all labs ordered are listed, but only abnormal results are displayed) Labs Reviewed - No data to display  EKG   Radiology No results found.  Procedures Procedures (including critical care time)  Medications Ordered in UC Medications - No data to display  Initial Impression / Assessment and Plan / UC Course  I have reviewed the triage vital signs and the nursing notes.  Pertinent labs & imaging results that were available during my care of the patient were reviewed by me and considered in my  medical decision making (see chart for details).  Acute bilateral low back pain without sciatica Acute midline thoracic back pain  Etiology of symptoms is most likely muscular exacerbated by work, discussed with patient, declined Toradol injection in office, prescribed naproxen and Flexeril for outpatient management as well as RICE, heat, daily stretching and activity as tolerated, given walking referral to orthopedics if symptoms continue to persist or worsen, work note given Final Clinical Impressions(s) / UC Diagnoses   Final diagnoses:  None   Discharge Instructions   None    ED Prescriptions   None    PDMP not reviewed this encounter.   Hans Eden, [place] 01/14/22 [phone]

## 2022-01-13 NOTE — Discharge Instructions (Addendum)
Your pain is most likely caused by irritation to the muscles or ligaments.   Take naproxen twice a day for the next 5 days as needed, this reduces inflammation that occurs with injury, and also will help with pain  You may use Flexeril twice daily as needed for additional comfort, be mindful this medication may make you drowsy, if this occurs you may use at bedtime  You may use heating pad in 15 minute intervals as needed for additional comfort or you may find comfort in using ice in 10-15 minutes over affected area  Begin stretching affected area daily for 10 minutes as tolerated to further loosen muscles   When lying down place pillow underneath and between knees for support  Can try sleeping without pillow on firm mattress   Practice good posture: head back, shoulders back, chest forward, pelvis back and weight distributed evenly on both legs  If pain persist after recommended treatment or reoccurs if may be beneficial to follow up with orthopedic specialist for evaluation, this doctor specializes in the bones and can manage your symptoms long-term with options such as but not limited to imaging, medications or physical therapy

## 2022-01-21 ENCOUNTER — Encounter (HOSPITAL_COMMUNITY): Payer: Self-pay | Admitting: Emergency Medicine

## 2022-01-21 ENCOUNTER — Ambulatory Visit (INDEPENDENT_AMBULATORY_CARE_PROVIDER_SITE_OTHER): Payer: Self-pay

## 2022-01-21 ENCOUNTER — Ambulatory Visit (HOSPITAL_COMMUNITY)
Admission: EM | Admit: 2022-01-21 | Discharge: 2022-01-21 | Disposition: A | Discharge status: Home / Self Care | Payer: Self-pay | Attending: Emergency Medicine | Admitting: Emergency Medicine

## 2022-01-21 DIAGNOSIS — M545 Low back pain, unspecified: Secondary | ICD-10-CM

## 2022-01-21 MED ORDER — KETOROLAC TROMETHAMINE 60 MG/2ML IM SOLN
30.0000 mg | Freq: Once | INTRAMUSCULAR | Status: AC
  Administered 2022-01-21: 30 mg via INTRAMUSCULAR

## 2022-01-21 MED ORDER — KETOROLAC TROMETHAMINE 30 MG/ML IJ SOLN
INTRAMUSCULAR | Status: AC
  Filled 2022-01-21: qty 1

## 2022-01-21 NOTE — Discharge Instructions (Addendum)
I recommend continuing your twice daily muscle relaxer in combination with the naproxen (anti-inflammatory medicine).  Since you have received a shot of Toradol (another anti-inflammatory medicine) in clinic today, I would not take the naproxen until tonight.  I recommend follow-up with orthopedics if your symptoms persist even with using the medicine.  Their contact information is provided and they offer walk-in hours.  Please go to the emergency department if your symptoms worsen.
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
## 2022-05-21 ENCOUNTER — Encounter (HOSPITAL_COMMUNITY): Payer: Self-pay

## 2022-05-21 ENCOUNTER — Ambulatory Visit (HOSPITAL_COMMUNITY)
Admission: EM | Admit: 2022-05-21 | Discharge: 2022-05-21 | Disposition: A | Discharge status: Home / Self Care | Payer: Self-pay | Attending: Family Medicine | Admitting: Family Medicine

## 2022-05-21 DIAGNOSIS — M545 Low back pain, unspecified: Secondary | ICD-10-CM

## 2022-05-21 MED ORDER — KETOROLAC TROMETHAMINE 30 MG/ML IJ SOLN
INTRAMUSCULAR | Status: AC
  Filled 2022-05-21: qty 1

## 2022-05-21 MED ORDER — KETOROLAC TROMETHAMINE 30 MG/ML IJ SOLN
30.0000 mg | Freq: Once | INTRAMUSCULAR | Status: AC
  Administered 2022-05-21: 30 mg via INTRAMUSCULAR

## 2022-05-21 MED ORDER — IBUPROFEN 600 MG PO TABS: 600.0000 mg | ORAL_TABLET | Freq: Four times a day (QID) | ORAL | 0 refills | Status: DC | PRN
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
## 2022-05-21 NOTE — Discharge Instructions (Addendum)
You were seen today for low back pain.  I have given you a shot of toradol today, and sent out motrin to your pharmacy.  I recommend you use a heating pad on your back to help with pain, and rest.
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
## 2022-06-16 ENCOUNTER — Ambulatory Visit (HOSPITAL_COMMUNITY)
Admission: EM | Admit: 2022-06-16 | Discharge: 2022-06-16 | Disposition: A | Discharge status: Home / Self Care | Payer: Self-pay | Attending: Physician Assistant | Admitting: Physician Assistant

## 2022-06-16 ENCOUNTER — Encounter (HOSPITAL_COMMUNITY): Payer: Self-pay | Admitting: Emergency Medicine

## 2022-06-16 DIAGNOSIS — J309 Allergic rhinitis, unspecified: Secondary | ICD-10-CM

## 2022-06-16 DIAGNOSIS — J029 Acute pharyngitis, unspecified: Secondary | ICD-10-CM

## 2022-06-16 DIAGNOSIS — R0982 Postnasal drip: Secondary | ICD-10-CM

## 2022-06-16 LAB — POCT RAPID STREP A, ED / UC: Streptococcus, Group A Screen (Direct): NEGATIVE

## 2022-06-16 MED ORDER — FLUTICASONE PROPIONATE 50 MCG/ACT NA SUSP: 2.0000 | Freq: Every day | NASAL | 0 refills | Status: DC

## 2022-06-16 MED ORDER — LORATADINE 10 MG PO TABS: 10.0000 mg | ORAL_TABLET | Freq: Every day | ORAL | 0 refills | Status: DC

## 2022-06-16 NOTE — ED Triage Notes (Signed)
Pt reports a fever, sore throat and productive cough x 1 week. Denies taking any OTC medication.

## 2022-06-16 NOTE — Discharge Instructions (Signed)
Your test for strep throat was negative.  I think you may have common cold or allergies with sinus drip that is causing the sore throat.  You may take Flonase and Claritin as directed.  I sent these medications to your pharmacy.  Try to stay hydrated.  Use nasal saline over-the-counter several times daily to help keep your nasal passages clear as well.  Throat lozenges to help with the sore throat.  Recheck if worse or no improvement of symptoms.  When you do have insurance established, we are happy to help you find a primary care provider.

## 2022-06-16 NOTE — ED Provider Notes (Signed)
[facility]   MRN: [id number] DOB: September 05, 1995  Subjective:   Rick Cooper is a 26 y.o. male presenting for sore throat, subjective fever, cough, nasal congestion and postnasal drip for the last week.  He has not taken any medication over-the-counter for treatment.  He denies any close sick contacts.  He does not have any other symptoms at this time.  No current facility-administered medications for this encounter.  Current Outpatient Medications:    fluticasone (FLONASE) 50 MCG/ACT nasal spray, Place 2 sprays into both nostrils daily., Disp: 16 g, Rfl: 0   loratadine (CLARITIN) 10 MG tablet, Take 1 tablet (10 mg total) by mouth daily., Disp: 30 tablet, Rfl: 0   ibuprofen (ADVIL) 600 MG tablet, Take 1 tablet (600 mg total) by mouth every 6 (six) hours as needed., Disp: 30 tablet, Rfl: 0   No Known Allergies  History reviewed. No pertinent past medical history.   History reviewed. No pertinent surgical history.  Family History  Family history unknown: Yes    Social History   Tobacco Use   Smoking status: Never   Smokeless tobacco: Never  Substance Use Topics   Alcohol use: Yes    Comment: occas   Drug use: Yes    Types: Marijuana    ROS REFER TO HPI FOR PERTINENT POSITIVES AND NEGATIVES   Objective:   Vitals: BP 111/72 (BP Location: Left Arm)   Pulse 70   Temp 98.2 F (36.8 C) (Oral)   Resp 16   SpO2 97%   Physical Exam Vitals and nursing note reviewed.  Constitutional:      General: He is not in acute distress.    Appearance: Normal appearance. He is not ill-appearing.  HENT:     Head: Normocephalic.     Right Ear: Tympanic membrane, ear canal and external ear normal.     Left Ear: Tympanic membrane, ear canal and external ear normal.     Nose: Congestion (+postnasal drip) present.     Mouth/Throat:     Mouth: Mucous membranes are moist.     Pharynx: Posterior oropharyngeal erythema present. No oropharyngeal exudate.     Tonsils:  No tonsillar exudate. 2+ on the right. 2+ on the left.  Eyes:     Extraocular Movements: Extraocular movements intact.     Conjunctiva/sclera: Conjunctivae normal.     Pupils: Pupils are equal, round, and reactive to light.  Cardiovascular:     Rate and Rhythm: Normal rate and regular rhythm.     Pulses: Normal pulses.     Heart sounds: Normal heart sounds. No murmur heard. Pulmonary:     Effort: Pulmonary effort is normal. No respiratory distress.     Breath sounds: Normal breath sounds. No wheezing.  Musculoskeletal:     Cervical back: Normal range of motion and neck supple.  Skin:    General: Skin is warm.  Neurological:     Mental Status: He is alert and oriented to person, place, and time.  Psychiatric:        Mood and Affect: Mood normal.        Behavior: Behavior normal.     Results for orders placed or performed during the hospital encounter of 06/16/22 (from the past 24 hour(s))  POCT Rapid Strep A     Status: None   Collection Time: 06/16/22 12:19 PM  Result Value Ref Range   Streptococcus, Group A Screen (Direct) NEGATIVE NEGATIVE    Assessment and Plan :  PDMP not reviewed this encounter.  1. Acute pharyngitis, unspecified etiology   2. Allergic rhinitis with postnasal drip    Rapid strep test was negative.  Most likely allergens or common cold virus.  Did not see indication today for prescribing antibiotics and discussed this with him.  Conservative treatment as discussed.  Flonase and Claritin sent into his pharmacy.  He knows to recheck if worse or no improvement of symptoms.  Work note provided.    Kalob Bergen, Randa Evens, PA-C 06/16/22 1231

## 2022-12-09 ENCOUNTER — Encounter (HOSPITAL_COMMUNITY): Payer: Self-pay | Admitting: *Deleted

## 2022-12-09 ENCOUNTER — Ambulatory Visit (HOSPITAL_COMMUNITY)
Admission: EM | Admit: 2022-12-09 | Discharge: 2022-12-09 | Disposition: A | Discharge status: Home / Self Care | Payer: Self-pay | Attending: Family Medicine | Admitting: Family Medicine

## 2022-12-09 DIAGNOSIS — M549 Dorsalgia, unspecified: Secondary | ICD-10-CM

## 2022-12-09 MED ORDER — KETOROLAC TROMETHAMINE 30 MG/ML IJ SOLN
INTRAMUSCULAR | Status: AC
  Filled 2022-12-09: qty 1

## 2022-12-09 MED ORDER — KETOROLAC TROMETHAMINE 10 MG PO TABS: 10.0000 mg | ORAL_TABLET | Freq: Four times a day (QID) | ORAL | 0 refills | Status: DC | PRN

## 2022-12-09 MED ORDER — KETOROLAC TROMETHAMINE 30 MG/ML IJ SOLN
30.0000 mg | Freq: Once | INTRAMUSCULAR | Status: AC
  Administered 2022-12-09: 30 mg via INTRAMUSCULAR

## 2022-12-09 NOTE — ED Provider Notes (Signed)
[facility]    CSN: [id number] Arrival date & time: 12/09/22  1018      History   Chief Complaint Chief Complaint  Patient presents with   Back Pain    HPI Rick Cooper is a 27 y.o. male.    Back Pain  Here for pain in his left lower thoracic and upper lumbar area.  It began 2 days ago. No fall or trauma and no fever or cough or congestion. It worsens with bending or sitting up, and also with twisting of his torso. No rash and no dysuria or hematuria  He has had this happen several times, and the Toradol injection often helps.  He states that the muscle relaxers and ibuprofen have not helped much in the past  No known drug allergies    History reviewed. No pertinent past medical history.  There are no problems to display for this patient.   History reviewed. No pertinent surgical history.     Home Medications    Prior to Admission medications   Medication Sig Start Date End Date Taking? Authorizing Provider  ketorolac (TORADOL) 10 MG tablet Take 1 tablet (10 mg total) by mouth every 6 (six) hours as needed (pain). 12/09/22  Yes Zenia Resides, MD  fluticasone (FLONASE) 50 MCG/ACT nasal spray Place 2 sprays into both nostrils daily. 06/16/22   Allwardt, Crist Infante, PA-C  loratadine (CLARITIN) 10 MG tablet Take 1 tablet (10 mg total) by mouth daily. 06/16/22   Allwardt, Crist Infante, PA-C    Family History Family History  Family history unknown: Yes    Social History Social History   Tobacco Use   Smoking status: Never   Smokeless tobacco: Never  Vaping Use   Vaping Use: Never used  Substance Use Topics   Alcohol use: Not Currently   Drug use: Not Currently    Types: Marijuana     Allergies   Patient has no known allergies.   Review of Systems Review of Systems  Musculoskeletal:  Positive for back pain.     Physical Exam Triage Vital Signs ED Triage Vitals [12/09/22 1053]  Enc Vitals Group     BP 109/65     Pulse Rate 70      Resp 18     Temp 98.6 F (37 C)     Temp Source Oral     SpO2 97 %     Weight      Height      Head Circumference      Peak Flow      Pain Score 0     Pain Loc      Pain Edu?      Excl. in [place]?    No data found.  Updated Vital Signs BP 109/65   Pulse 70   Temp 98.6 F (37 C) (Oral)   Resp 18   SpO2 97%   Visual Acuity Right Eye Distance:   Left Eye Distance:   Bilateral Distance:    Right Eye Near:   Left Eye Near:    Bilateral Near:     Physical Exam Vitals reviewed.  Constitutional:      General: He is not in acute distress.    Appearance: He is not ill-appearing, toxic-appearing or diaphoretic.  HENT:     Mouth/Throat:     Mouth: Mucous membranes are moist.  Eyes:     Extraocular Movements: Extraocular movements intact.     Conjunctiva/sclera: Conjunctivae normal.  Pupils: Pupils are equal, round, and reactive to light.  Cardiovascular:     Rate and Rhythm: Normal rate and regular rhythm.     Heart sounds: No murmur heard. Pulmonary:     Effort: Pulmonary effort is normal. No respiratory distress.     Breath sounds: Normal breath sounds. No stridor. No wheezing, rhonchi or rales.  Musculoskeletal:     Cervical back: Neck supple.     Comments: He is tender in his left lower thoracic and left upper lumbar..  No deformity.  Straight leg raise is negative  Lymphadenopathy:     Cervical: No cervical adenopathy.  Neurological:     General: No focal deficit present.     Mental Status: He is alert and oriented to person, place, and time.  Psychiatric:        Behavior: Behavior normal.      UC Treatments / Results  Labs (all labs ordered are listed, but only abnormal results are displayed) Labs Reviewed - No data to display  EKG   Radiology No results found.  Procedures Procedures (including critical care time)  Medications Ordered in UC Medications  ketorolac (TORADOL) 30 MG/ML injection 30 mg (has no administration in time range)     Initial Impression / Assessment and Plan / UC Course  I have reviewed the triage vital signs and the nursing notes.  Pertinent labs & imaging results that were available during my care of the patient were reviewed by me and considered in my medical decision making (see chart for details).        Injection of Toradol is given here, and a small quantity of Toradol tablets are sent into the pharmacy. Final Clinical Impressions(s) / UC Diagnoses   Final diagnoses:  Mid back pain on left side     Discharge Instructions      You have been given a shot of Toradol 30 mg today.  Ketorolac 10 mg tablets--take 1 tablet every 6 hours as needed for pain.  This is the same medicine that is in the shot we just gave you       ED Prescriptions     Medication Sig Dispense Auth. Provider   ketorolac (TORADOL) 10 MG tablet Take 1 tablet (10 mg total) by mouth every 6 (six) hours as needed (pain). 20 tablet Zachary Lovins, Janace Aris, MD      I have reviewed the PDMP during this encounter.   Zenia Resides, MD 12/09/22 [phone]

## 2022-12-09 NOTE — ED Triage Notes (Signed)
Pt denies injury. C/O back pain intermittently over the past couple years, with a flare-up of constant left mid-back pain over past 2 days. States was sitting in bathtub 2 days ago, and believes the way he was sitting may have caused a flare-up. Pain worse with deep breathing, and radiates up into left upper back with movement. Also c/o numbness intermittently to left toes. Has tried Aleve without relief.

## 2022-12-09 NOTE — Discharge Instructions (Signed)
You have been given a shot of Toradol 30 mg today.  Ketorolac 10 mg tablets--take 1 tablet every 6 hours as needed for pain.  This is the same medicine that is in the shot we just gave you

## 2022-12-30 ENCOUNTER — Encounter (HOSPITAL_COMMUNITY): Payer: Self-pay

## 2022-12-30 ENCOUNTER — Ambulatory Visit (HOSPITAL_COMMUNITY)
Admission: EM | Admit: 2022-12-30 | Discharge: 2022-12-30 | Disposition: A | Discharge status: Home / Self Care | Payer: Self-pay | Attending: Internal Medicine | Admitting: Internal Medicine

## 2022-12-30 DIAGNOSIS — S39012A Strain of muscle, fascia and tendon of lower back, initial encounter: Secondary | ICD-10-CM

## 2022-12-30 DIAGNOSIS — G8929 Other chronic pain: Secondary | ICD-10-CM

## 2022-12-30 DIAGNOSIS — M546 Pain in thoracic spine: Secondary | ICD-10-CM

## 2022-12-30 MED ORDER — KETOROLAC TROMETHAMINE 30 MG/ML IJ SOLN
INTRAMUSCULAR | Status: AC
  Filled 2022-12-30: qty 1

## 2022-12-30 MED ORDER — KETOROLAC TROMETHAMINE 30 MG/ML IJ SOLN: 30.0000 mg | Freq: Once | INTRAMUSCULAR | Status: DC

## 2022-12-30 MED ORDER — METHOCARBAMOL 500 MG PO TABS: 500.0000 mg | ORAL_TABLET | Freq: Two times a day (BID) | ORAL | 0 refills | Status: DC

## 2022-12-30 NOTE — Discharge Instructions (Addendum)
Your pain is likely due to a muscle strain which will improve on its own with time.   - Take aleve with food every 12 hours as needed for pain and inflammation. Do not take any other NSAID containing medicine when taking aleve.   - You may start taking aleve tomorrow since you were given a dose of ketorolac injection in clinic today for pain and inflammation. - You may also take the prescribed muscle relaxer as directed as needed for muscle aches/spasm.  Do not take this medication and drive or drink alcohol as it can make you sleepy.  Mainly use this medicine at nighttime as needed. - Apply heat 20 minutes on then 20 minutes off and perform gentle range of motion exercises to the area of greatest pain to prevent muscle stiffness and provide further pain relief.   Red flag symptoms to watch out for are numbness/tingling to the legs, weakness, loss of bowel/bladder control, and/or worsening pain that does not respond well to medicines. Follow-up with your primary care provider or return to urgent care if your symptoms do not improve in the next 3 to 4 days with medications and interventions recommended today. If your symptoms are severe (red flag), please go to the emergency room.  I hope you feel better!
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
## 2022-12-30 NOTE — ED Triage Notes (Signed)
Patient here today with c/o LB pain since Friday. No known injury. He states that the pain is sharp and describes it as spasms. He does have numbness and tingling in his left toes. Pain increased whenever he sits straight up. He has taken Aleve but did not help.

## 2023-05-28 IMAGING — DX DG LUMBAR SPINE 2-3V
3 series · 3 of 3 positions shown · non-contrast
Comparison: None Available.

CLINICAL DATA: back pain

EXAM:
LUMBAR SPINE - 2-3 VIEW

[l-spine ap]
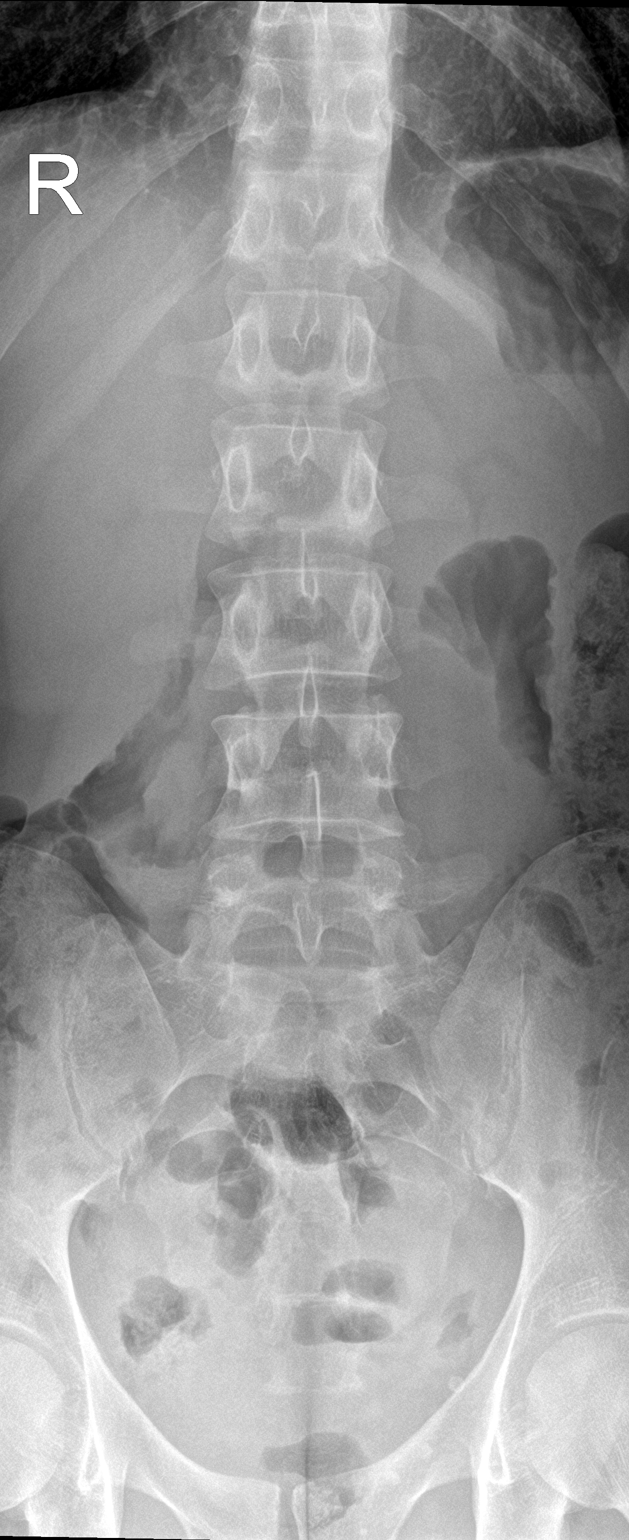

[l-spine lat (1 of 2)]
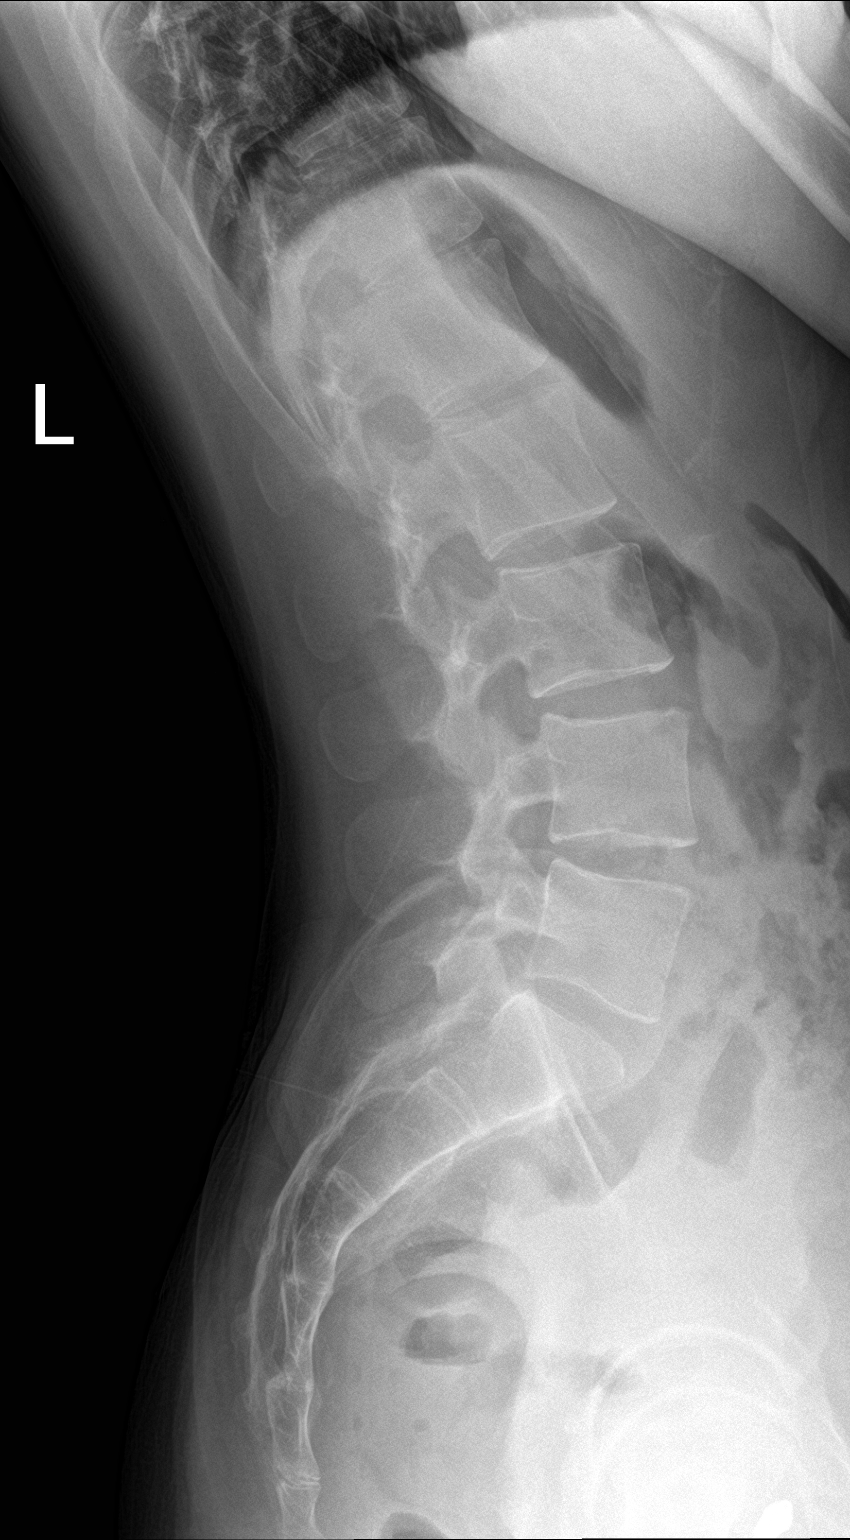

[l-spine lat (2 of 2)]
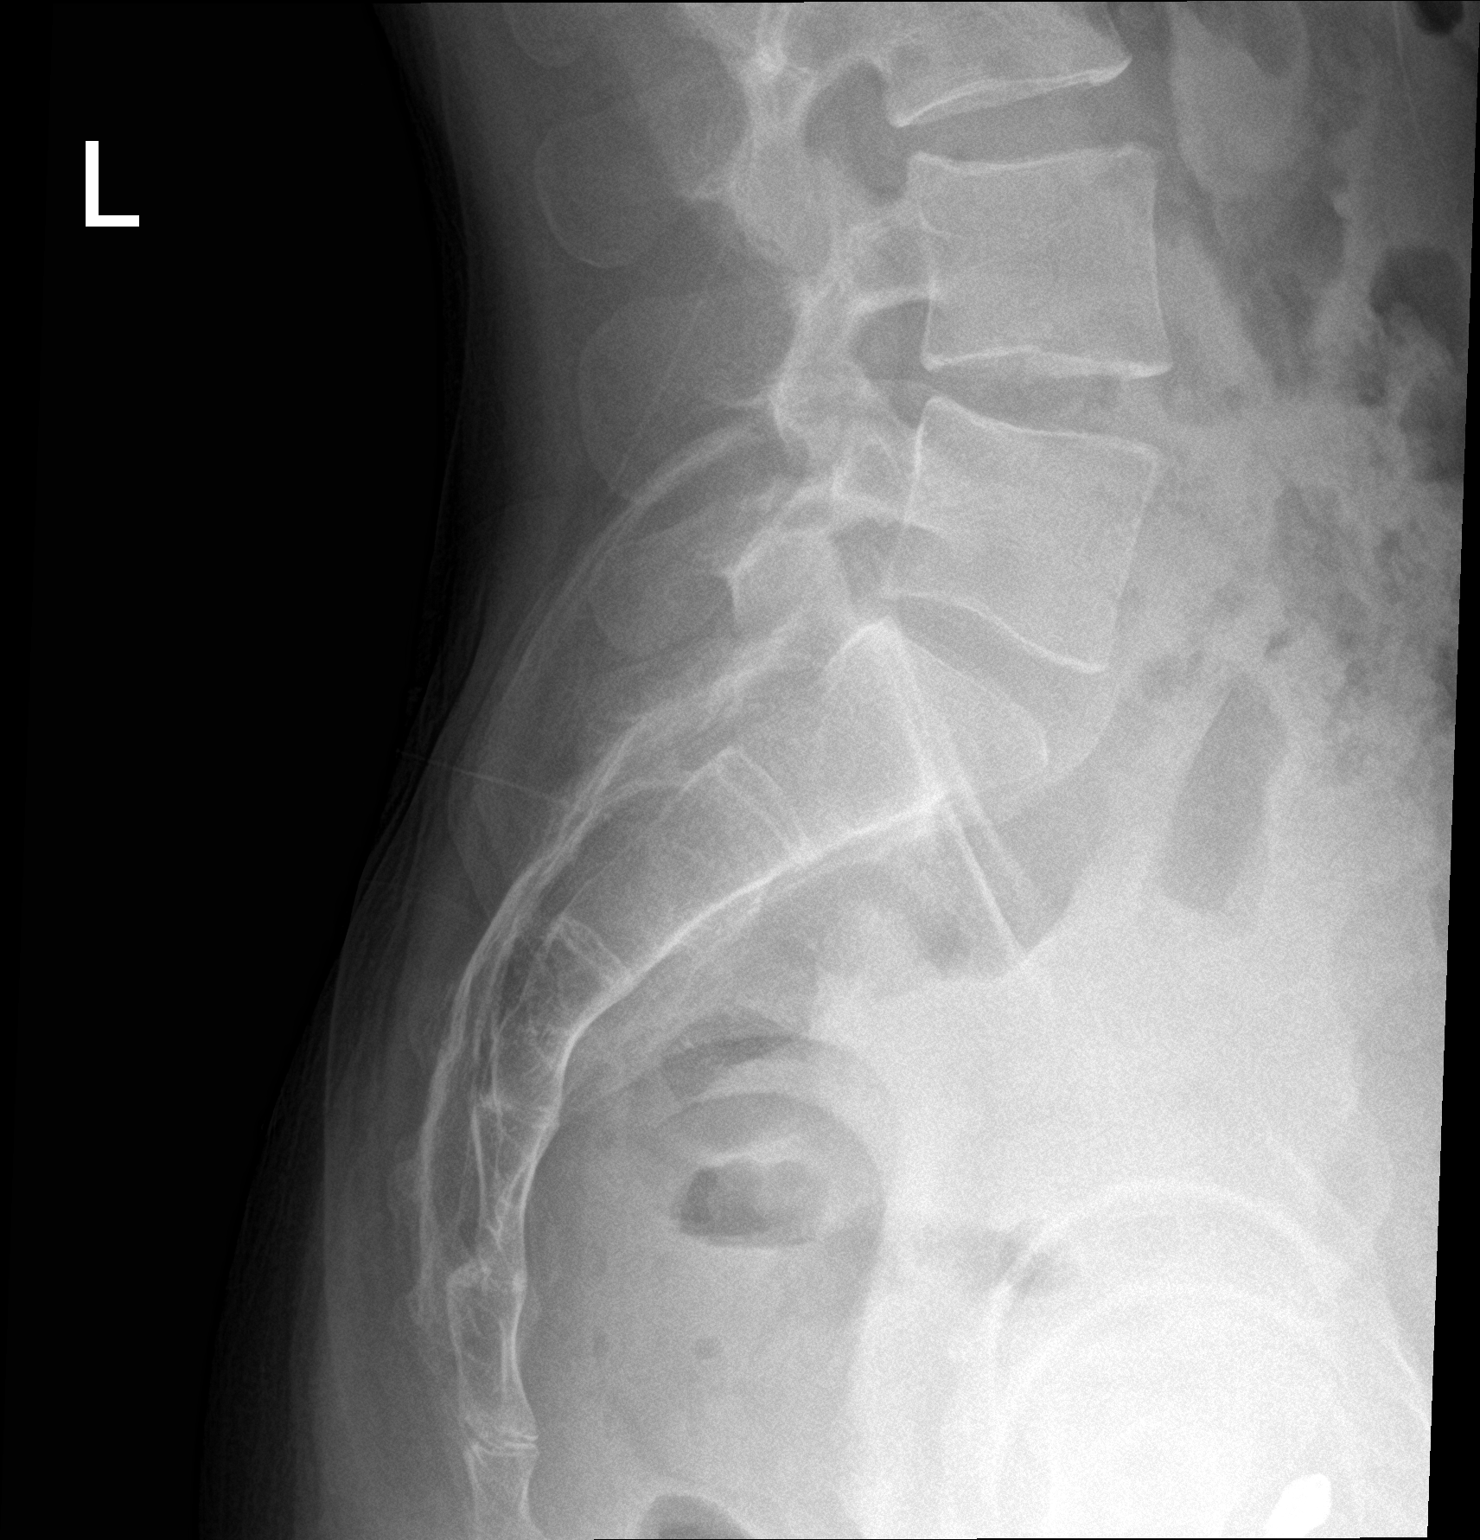

[3 of 3 positions shown; findings below may reference images not displayed]

FINDINGS: There are five non-rib bearing lumbar-type vertebral bodies. There
is normal alignment. There is no evidence for acute fracture or
subluxation. Intervertebral disc spaces are preserved without
significant degenerative changes. Visualized abdomen is
unremarkable.
IMPRESSION: Negative.

## 2023-08-06 ENCOUNTER — Ambulatory Visit (HOSPITAL_COMMUNITY)
Admission: EM | Admit: 2023-08-06 | Discharge: 2023-08-06 | Discharge status: Against Medical Advice | Payer: Self-pay | Attending: Family Medicine | Admitting: Family Medicine

## 2023-08-06 NOTE — ED Triage Notes (Signed)
No answer in waiting area x2 

## 2023-08-06 NOTE — ED Notes (Signed)
Informed by front desk Patient no longer in the lobby. Patient LWBS before triage.

## 2023-12-12 ENCOUNTER — Ambulatory Visit (INDEPENDENT_AMBULATORY_CARE_PROVIDER_SITE_OTHER): Payer: Self-pay

## 2023-12-12 ENCOUNTER — Encounter (HOSPITAL_COMMUNITY): Payer: Self-pay

## 2023-12-12 ENCOUNTER — Ambulatory Visit (HOSPITAL_COMMUNITY)
Admission: EM | Admit: 2023-12-12 | Discharge: 2023-12-12 | Disposition: A | Discharge status: Home / Self Care | Payer: Self-pay | Attending: Family Medicine | Admitting: Family Medicine

## 2023-12-12 DIAGNOSIS — M25562 Pain in left knee: Secondary | ICD-10-CM

## 2023-12-12 NOTE — ED Triage Notes (Signed)
 Patient presenting with left knee pain onset yesterday. Patient was playing basketball and fell into someone else's knee. Having swelling and walking with a visible limp.  Prescriptions or OTC medications tried: No

## 2023-12-12 NOTE — Discharge Instructions (Signed)
 You were seen today for knee pain.  Your xray was normal.  I have given you a brace for support.  I recommend you ice the knee and use motrin  as needed for pain.  If you continue with pain, please see your primary care provider or orthopedist for further evaluation.

## 2023-12-12 NOTE — ED Provider Notes (Signed)
 [facility]    CSN: [id number] Arrival date & time: 12/12/23  0946      History   Chief Complaint Chief Complaint  Patient presents with   Knee Pain    HPI Rick Cooper is a 28 y.o. male.    Knee Pain  Patient is here for left knee pain.  He was playing basketball yesterday and collided with another player.  Since then having left knee pain, primarily above the knee cap;  he has swelling and difficulty bearing weight.        History reviewed. No pertinent past medical history.  There are no active problems to display for this patient.   History reviewed. No pertinent surgical history.     Home Medications    Prior to Admission medications   Medication Sig Start Date End Date Taking? Authorizing Provider  fluticasone  (FLONASE ) 50 MCG/ACT nasal spray Place 2 sprays into both nostrils daily. 06/16/22   Allwardt, Alyssa M, PA-C  ketorolac  (TORADOL ) 10 MG tablet Take 1 tablet (10 mg total) by mouth every 6 (six) hours as needed (pain). 12/09/22   Ann Keto, MD  loratadine  (CLARITIN ) 10 MG tablet Take 1 tablet (10 mg total) by mouth daily. 06/16/22   Allwardt, Alyssa M, PA-C  methocarbamol  (ROBAXIN ) 500 MG tablet Take 1 tablet (500 mg total) by mouth 2 (two) times daily. 12/30/22   Starlene Eaton, FNP    Family History Family History  Family history unknown: Yes    Social History Social History   Tobacco Use   Smoking status: Never   Smokeless tobacco: Never  Vaping Use   Vaping status: Never Used  Substance Use Topics   Alcohol use: Not Currently   Drug use: Not Currently    Types: Marijuana     Allergies   Patient has no known allergies.   Review of Systems Review of Systems  Constitutional: Negative.   HENT: Negative.    Respiratory: Negative.    Cardiovascular: Negative.   Gastrointestinal: Negative.   Musculoskeletal:  Positive for gait problem.     Physical Exam Triage Vital Signs ED Triage Vitals   Encounter Vitals Group     BP 12/12/23 1117 121/74     Systolic BP Percentile --      Diastolic BP Percentile --      Pulse Rate 12/12/23 1117 (!) 56     Resp 12/12/23 1117 16     Temp 12/12/23 1117 97.8 F (36.6 C)     Temp Source 12/12/23 1117 Oral     SpO2 12/12/23 1117 98 %     Weight --      Height --      Head Circumference --      Peak Flow --      Pain Score 12/12/23 1116 6     Pain Loc --      Pain Education --      Exclude from Growth Chart --    No data found.  Updated Vital Signs BP 121/74 (BP Location: Right Arm)   Pulse (!) 56   Temp 97.8 F (36.6 C) (Oral)   Resp 16   SpO2 98%   Visual Acuity Right Eye Distance:   Left Eye Distance:   Bilateral Distance:    Right Eye Near:   Left Eye Near:    Bilateral Near:     Physical Exam Constitutional:      Appearance: Normal appearance. He is normal weight.  Musculoskeletal:  Comments: Slight swelling noted above the left knee cap;  he has TTP to the superior portion of the patella, and insertion of the quadriceps to the patella;   Pain with flexion and extension of the knee  Skin:    General: Skin is warm.  Neurological:     General: No focal deficit present.     Mental Status: He is alert.  Psychiatric:        Mood and Affect: Mood normal.      UC Treatments / Results  Labs (all labs ordered are listed, but only abnormal results are displayed) Labs Reviewed - No data to display  EKG   Radiology DG Knee Complete 4 Views Left Result Date: 12/12/2023 CLINICAL DATA:  Acute left knee pain after fall playing basketball. EXAM: LEFT KNEE - COMPLETE 4+ VIEW COMPARISON:  None Available. FINDINGS: No evidence of fracture, dislocation, or joint effusion. No evidence of arthropathy or other focal bone abnormality. Soft tissues are unremarkable. IMPRESSION: Negative. Electronically Signed   By: Rosalene Colon M.D.   On: 12/12/2023 11:52    Procedures Procedures (including critical care  time)  Medications Ordered in UC Medications - No data to display  Initial Impression / Assessment and Plan / UC Course  I have reviewed the triage vital signs and the nursing notes.  Pertinent labs & imaging results that were available during my care of the patient were reviewed by me and considered in my medical decision making (see chart for details).   Final Clinical Impressions(s) / UC Diagnoses   Final diagnoses:  Acute pain of left knee     Discharge Instructions      You were seen today for knee pain.  Your xray was normal.  I have given you a brace for support.  I recommend you ice the knee and use motrin  as needed for pain.  If you continue with pain, please see your primary care provider or orthopedist for further evaluation.      ED Prescriptions   None    PDMP not reviewed this encounter.   Lesle Ras, MD 12/12/23 1158

## 2024-05-22 ENCOUNTER — Ambulatory Visit (HOSPITAL_COMMUNITY)
Admission: EM | Admit: 2024-05-22 | Discharge: 2024-05-22 | Disposition: A | Discharge status: Home / Self Care | Payer: Self-pay | Attending: Physician Assistant | Admitting: Physician Assistant

## 2024-05-22 ENCOUNTER — Ambulatory Visit (INDEPENDENT_AMBULATORY_CARE_PROVIDER_SITE_OTHER): Payer: Self-pay

## 2024-05-22 ENCOUNTER — Encounter (HOSPITAL_COMMUNITY): Payer: Self-pay

## 2024-05-22 DIAGNOSIS — M546 Pain in thoracic spine: Secondary | ICD-10-CM

## 2024-05-22 DIAGNOSIS — M545 Low back pain, unspecified: Secondary | ICD-10-CM

## 2024-05-22 MED ORDER — METHOCARBAMOL 500 MG PO TABS: 500.0000 mg | ORAL_TABLET | Freq: Two times a day (BID) | ORAL | 0 refills | Status: AC

## 2024-05-22 MED ORDER — KETOROLAC TROMETHAMINE 30 MG/ML IJ SOLN
INTRAMUSCULAR | Status: AC
  Filled 2024-05-22: qty 1

## 2024-05-22 MED ORDER — DICLOFENAC SODIUM 50 MG PO TBEC: 50.0000 mg | DELAYED_RELEASE_TABLET | Freq: Two times a day (BID) | ORAL | 0 refills | Status: DC

## 2024-05-22 MED ORDER — KETOROLAC TROMETHAMINE 30 MG/ML IJ SOLN: 30.0000 mg | Freq: Once | INTRAMUSCULAR | Status: DC

## 2024-05-22 NOTE — Discharge Instructions (Signed)
 I did not see anything on your x-ray.  I will contact you if the radiologist sees something that changes our treatment plan.  Start diclofenac up to twice a day.  Do not take additional NSAIDs with this medication including aspirin, ibuprofen /Advil , naproxen /Aleve .  Take methocarbamol  up to twice a day.  This to make you sleepy so do not drive or drink alcohol with taking it.  I recommend that you follow-up with sports medicine for further evaluation and management.  Call them to schedule an appointment.  If you have any worsening symptoms including increasing pain, numbness or tingling in your hands or legs, weakness, going to the bathroom yourself without noticing you need to be seen immediately.
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
## 2024-06-08 ENCOUNTER — Encounter: Payer: Self-pay | Admitting: Family Medicine

## 2024-06-08 ENCOUNTER — Ambulatory Visit: Payer: Self-pay | Admitting: Family Medicine

## 2024-06-08 VITALS — BP 130/76 | Ht 72.0 in | Wt 145.0 lb

## 2024-06-08 DIAGNOSIS — S29019A Strain of muscle and tendon of unspecified wall of thorax, initial encounter: Secondary | ICD-10-CM

## 2024-06-08 DIAGNOSIS — G8929 Other chronic pain: Secondary | ICD-10-CM

## 2024-06-08 DIAGNOSIS — S39012A Strain of muscle, fascia and tendon of lower back, initial encounter: Secondary | ICD-10-CM

## 2024-06-08 DIAGNOSIS — M549 Dorsalgia, unspecified: Secondary | ICD-10-CM

## 2024-06-08 MED ORDER — PREDNISONE 10 MG PO TABS: ORAL_TABLET | ORAL | 0 refills | Status: AC
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
## 2024-07-06 ENCOUNTER — Ambulatory Visit (INDEPENDENT_AMBULATORY_CARE_PROVIDER_SITE_OTHER): Payer: Self-pay | Admitting: Family Medicine

## 2024-07-06 ENCOUNTER — Encounter: Payer: Self-pay | Admitting: Family Medicine

## 2024-07-06 VITALS — BP 122/70 | Ht 72.0 in | Wt 145.0 lb

## 2024-07-06 DIAGNOSIS — S29019D Strain of muscle and tendon of unspecified wall of thorax, subsequent encounter: Secondary | ICD-10-CM
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
## 2024-08-04 ENCOUNTER — Encounter: Payer: Self-pay | Admitting: Family Medicine

## 2024-08-04 ENCOUNTER — Ambulatory Visit: Payer: Self-pay | Admitting: Family Medicine

## 2024-08-04 VITALS — BP 110/62 | Ht 72.0 in | Wt 145.0 lb

## 2024-08-04 DIAGNOSIS — G8929 Other chronic pain: Secondary | ICD-10-CM

## 2024-08-04 DIAGNOSIS — S29019D Strain of muscle and tendon of unspecified wall of thorax, subsequent encounter: Secondary | ICD-10-CM

## 2024-08-04 DIAGNOSIS — M549 Dorsalgia, unspecified: Secondary | ICD-10-CM
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
## 2024-08-04 NOTE — Patient Instructions (Signed)
 [facility]  [address] [place], [place], [place] [address] Phone: [phone]

## 2024-09-09 ENCOUNTER — Ambulatory Visit: Payer: Self-pay | Admitting: Physical Therapy

## 2024-09-22 ENCOUNTER — Ambulatory Visit: Payer: Self-pay

## 2024-09-22 VITALS — BP 102/64 | Ht 71.0 in | Wt 145.0 lb

## 2024-09-22 DIAGNOSIS — S29019D Strain of muscle and tendon of unspecified wall of thorax, subsequent encounter: Secondary | ICD-10-CM

## 2024-09-22 MED ORDER — DICLOFENAC SODIUM 75 MG PO TBEC: 75.0000 mg | DELAYED_RELEASE_TABLET | Freq: Two times a day (BID) | ORAL | 3 refills | Status: AC
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
## 2024-11-04 ENCOUNTER — Ambulatory Visit: Payer: Self-pay
